# Patient Record
Sex: Female | Born: 1990 | Race: White | Hispanic: No | Marital: Married | Smoking: Never smoker
Health system: Southern US, Community
[De-identification: ages and names within clinical notes are randomized; demographics above are authoritative.]

## PROBLEM LIST (undated history)

## (undated) DIAGNOSIS — R102 Pelvic and perineal pain: Secondary | ICD-10-CM

## (undated) DIAGNOSIS — N393 Stress incontinence (female) (male): Secondary | ICD-10-CM

## (undated) DIAGNOSIS — Z973 Presence of spectacles and contact lenses: Secondary | ICD-10-CM

## (undated) DIAGNOSIS — R011 Cardiac murmur, unspecified: Secondary | ICD-10-CM

## (undated) DIAGNOSIS — Z9889 Other specified postprocedural states: Secondary | ICD-10-CM

## (undated) DIAGNOSIS — N800 Endometriosis of uterus: Secondary | ICD-10-CM

## (undated) DIAGNOSIS — R112 Nausea with vomiting, unspecified: Secondary | ICD-10-CM

## (undated) DIAGNOSIS — K219 Gastro-esophageal reflux disease without esophagitis: Secondary | ICD-10-CM

## (undated) DIAGNOSIS — J302 Other seasonal allergic rhinitis: Secondary | ICD-10-CM

## (undated) DIAGNOSIS — N3281 Overactive bladder: Secondary | ICD-10-CM

## (undated) DIAGNOSIS — F419 Anxiety disorder, unspecified: Secondary | ICD-10-CM

## (undated) DIAGNOSIS — N8003 Adenomyosis of the uterus: Secondary | ICD-10-CM

## (undated) DIAGNOSIS — J45909 Unspecified asthma, uncomplicated: Secondary | ICD-10-CM

## (undated) DIAGNOSIS — G43909 Migraine, unspecified, not intractable, without status migrainosus: Secondary | ICD-10-CM

---
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
## 2009-01-12 ENCOUNTER — Ambulatory Visit: Payer: Self-pay | Admitting: Family Medicine

## 2009-01-12 DIAGNOSIS — H1045 Other chronic allergic conjunctivitis: Secondary | ICD-10-CM | POA: Insufficient documentation

## 2009-05-29 ENCOUNTER — Encounter: Admission: RE | Admit: 2009-05-29 | Discharge: 2009-05-29 | Payer: Self-pay | Admitting: Family Medicine

## 2009-05-29 ENCOUNTER — Ambulatory Visit: Payer: Self-pay | Admitting: Family Medicine

## 2009-05-29 DIAGNOSIS — IMO0002 Reserved for concepts with insufficient information to code with codable children: Secondary | ICD-10-CM | POA: Insufficient documentation

## 2010-05-03 ENCOUNTER — Ambulatory Visit: Payer: Self-pay | Admitting: Family Medicine

## 2010-05-03 LAB — CONVERTED CEMR LAB: Heterophile Ab Screen: NEGATIVE

## 2010-05-04 ENCOUNTER — Encounter: Payer: Self-pay | Admitting: Family Medicine

## 2010-05-04 LAB — CONVERTED CEMR LAB
Eosinophils Absolute: 0.4 10*3/uL (ref 0.0–0.7)
Eosinophils Relative: 4 % (ref 0–5)
HCT: 42.3 % (ref 36.0–46.0)
Hemoglobin: 14.5 g/dL (ref 12.0–15.0)
Lymphocytes Relative: 32 % (ref 12–46)
Lymphs Abs: 2.9 10*3/uL (ref 0.7–4.0)
MCV: 86.7 fL (ref 78.0–100.0)
Monocytes Absolute: 1 10*3/uL (ref 0.1–1.0)
Platelets: 375 10*3/uL (ref 150–400)
WBC: 9.2 10*3/uL (ref 4.0–10.5)

## 2010-05-25 ENCOUNTER — Ambulatory Visit: Payer: Self-pay | Admitting: Family Medicine

## 2010-05-25 ENCOUNTER — Encounter: Admission: RE | Admit: 2010-05-25 | Discharge: 2010-05-25 | Payer: Self-pay | Admitting: Family Medicine

## 2010-05-25 DIAGNOSIS — R109 Unspecified abdominal pain: Secondary | ICD-10-CM

## 2010-05-25 LAB — CONVERTED CEMR LAB
Basophils Absolute: 0.1 10*3/uL (ref 0.0–0.1)
Bilirubin Urine: NEGATIVE
Blood in Urine, dipstick: NEGATIVE
Eosinophils Absolute: 0.6 10*3/uL (ref 0.0–0.7)
Eosinophils Relative: 5 % (ref 0–5)
HCT: 37.5 % (ref 36.0–46.0)
Hemoglobin: 13.3 g/dL (ref 12.0–15.0)
Ketones, urine, test strip: NEGATIVE
Lymphocytes Relative: 26 % (ref 12–46)
MCHC: 35.6 g/dL (ref 30.0–36.0)
MCV: 86.9 fL (ref 78.0–100.0)
Monocytes Absolute: 0.6 10*3/uL (ref 0.1–1.0)
Platelets: 363 10*3/uL (ref 150–400)
Protein, U semiquant: NEGATIVE
RDW: 12.6 % (ref 11.5–15.5)
Specific Gravity, Urine: 1.015
pH: 5.5

## 2010-05-27 LAB — CONVERTED CEMR LAB
ALT: 9 units/L (ref 0–35)
Alkaline Phosphatase: 42 units/L (ref 39–117)
CO2: 23 meq/L (ref 19–32)
Creatinine, Ser: 0.84 mg/dL (ref 0.40–1.20)
Sodium: 139 meq/L (ref 135–145)
Total Bilirubin: 0.6 mg/dL (ref 0.3–1.2)
Total Protein: 6.4 g/dL (ref 6.0–8.3)

## 2010-05-28 ENCOUNTER — Encounter: Payer: Self-pay | Admitting: Family Medicine

## 2010-08-07 ENCOUNTER — Ambulatory Visit: Payer: Self-pay | Admitting: Family Medicine

## 2010-08-07 DIAGNOSIS — N6019 Diffuse cystic mastopathy of unspecified breast: Secondary | ICD-10-CM

## 2010-10-02 NOTE — Assessment & Plan Note (Signed)
Summary: fibrocystic breasts/ OCPs   Vital Signs:  Patient profile:   20 year old female Height:      64 inches Weight:      122 pounds BMI:     21.02 O2 Sat:      100 % on Room air Temp:     98.4 degrees F oral Pulse rate:   63 / minute BP sitting:   109 / 70  (left arm) Cuff size:   regular  Vitals Entered By: Payton Spark CMA (August 07, 2010 8:53 AM)  O2 Flow:  Room air CC: Tender lump in L breast x 3 weeks. Also would like to discuss birth control.    Primary Care Brenda Hull:  Seymour Bars DO  CC:  Tender lump in L breast x 3 weeks. Also would like to discuss birth control. Marland Kitchen  History of Present Illness: 20 yo WF presents for tender lump in the L breast x 3-4 wks.  She has not noticed it change over the past month.  She normally has breast pain with her periods.  She is interest in birth control.  She was on Yaz in the past but noticed an increase in migraines the wk before her period began.  She was then on Camila but stopped it b/c of breakthrough bleeding.  She does not have an aura preceding her migraines.  She is engaged and sexually active and would like to be on a contraceptive.   Current Medications (verified): 1)  None  Allergies (verified): 1)  ! Codeine  Past History:  Past Medical History: Reviewed history from 10/21/2006 and no changes required. constipation  hx of heart murmur  Social History: Reviewed history from 12/29/2008 and no changes required. Working at [employer].  Taking online college classes. Involved in dance.  Does well in school.  Nonsmoker.  Lives w/ mom, dad. Has an older sister Morrie Sheldon. Has a boyfriend.  Sexually active.    Review of Systems      See HPI  Physical Exam  General:  alert, well-developed, well-nourished, and well-hydrated.   Mouth:  good dentition and pharynx pink and moist.   Neck:  no masses.   Breasts:  No mass, nodules, thickening, tenderness, bulging, retraction, inflamation, nipple discharge or skin changes  noted.  fibrocystic tissue bilat Lungs:  Normal respiratory effort, chest expands symmetrically. Lungs are clear to auscultation, no crackles or wheezes. Heart:  Normal rate and regular rhythm. S1 and S2 normal without gallop, murmur, click, rub or other extra sounds. Skin:  color normal.   Axillary Nodes:  No palpable lymphadenopathy Psych:  good eye contact, not anxious appearing, and not depressed appearing.     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  I do not appreciate any masses on exam today (she was squeezing breast together on self exam).  I have give her a h/o on fibrocystic breast disease today and recommended monthly self exams.    Problem # 2:  CONTRACEPTIVE MANAGEMENT (ICD-V25.09) Discussed options.  Because she has menstrual migraines, I think she would do well on extended cycle OCPs which she is very interested in.  Will start generic Seasonique on Sunday (she is currently on her period).  Expect breakthrough bleeding the first few months.  Call if any problems.  Complete Medication List: 1)  Amethia 0.15-0.03 &0.01 Mg Tabs (Levonorgest-eth estrad 91-day) .Marland Kitchen.. 1 tab by mouth daily as directed  Patient  Instructions: 1)  Start Amethia (generic for SEASONIQUE) on Sunday. 2)  Call if any problems. Prescriptions: AMETHIA 0.15-0.03 &0.01 MG TABS (LEVONORGEST-ETH ESTRAD 91-DAY) 1 tab by mouth daily as directed  #3 mos pack x 3   Entered and Authorized by:   Karen Bowen DO   Signed by:   Karen Bowen DO on 08/07/2010   Method used:   Electronically to        [facility]  [address] (retail)       [address]       [place], [place]  [address]       Ph: [phone]       Fax: [phone]   RxID:   [id number]    Orders Added: 1)  Est. Patient Level III [84132]

## 2010-10-02 NOTE — Letter (Signed)
Summary: Out of Work  [facility]  [address], [address]   [place], [place] [address]   Phone: [phone]  Fax: [phone]    May 03, 2010   Employee:  KAIYA BOATMAN    To Whom It May Concern:   For Medical reasons, please excuse the above named employee from work for the following dates:  Start:   05-03-2010  End:   Return 05-08-2010  If you need additional information, please feel free to contact our office.         Sincerely,    Nani Gasser MD

## 2010-10-02 NOTE — Assessment & Plan Note (Signed)
Summary: HA, nausea, etc   Vital Signs:  Patient profile:   20 year old female Height:      64 inches Weight:      121 pounds BMI:     20.84 Temp:     98.6 degrees F oral Pulse rate:   67 / minute BP sitting:   109 / 73  (left arm) Cuff size:   regular  Vitals Entered By: Avon Gully CMA, Duncan Dull) (May 03, 2010 12:59 PM) CC: HA' x 2 days, dizziness, finished an abx for ear infection, has not felt well since, nausea and body aches   Primary Care Provider:  Seymour Bars DO  CC:  HA' x 2 days, dizziness, finished an abx for ear infection, has not felt well since, and nausea and body aches.  History of Present Illness: HA' x 2 days, dizziness, finished an abx for ear infection, has not felt well since, nausea and body aches.  Left ear was infected and took azithromycin rx from [facility].Also put on antivert for dizziness Completed ABX almost 2 weeks ago. Her ear pain is better.  When wake up in the morning feels like something is running in her ears. Scratchy throat.  No fever. HA is frontal and causing some tension in her neck. HA is throbbing. Better wtih Advil but never really goes away. Some mild nasal congestions.  No rhinorrhea.  Nauseted but no diarrhea. having some aches in her shoulder and occ in her legs. Feels like her bones "are throbbing" at times to the point where she wants to cry.  Dizziness has gotten better. Feels tired all the time. Wants to sleep all day. Felt similar to this in 8th grade when had mono, minus the ear sxs.   Current Medications (verified): 1)  Camila 0.35 Mg Tabs (Norethindrone (Contraceptive)) .Marland Kitchen.. 1 Tab By Mouth Daily As Directed  Allergies (verified): 1)  ! Codeine  Comments:  Nurse/Medical Assistant: The patient's medications and allergies were reviewed with the patient and were updated in the Medication and Allergy Lists. Avon Gully CMA, Duncan Dull) (May 03, 2010 1:01 PM)  Physical Exam  General:   Well-developed,well-nourished,in no acute distress; alert,appropriate and cooperative throughout examination Head:  Normocephalic and atraumatic without obvious abnormalities. No apparent alopecia or balding. Eyes:  Slcera are injected. EOMi, PEERL Ears:  External ear exam shows no significant lesions or deformities.  Otoscopic examination reveals clear canals, tympanic membranes are intact bilaterally without bulging, retraction, inflammation or discharge. Hearing is grossly normal bilaterally. Nose:  External nasal examination shows no deformity or inflammation. Mouth:  Op mildy erytthematous. Neck:  No deformities, masses, or tenderness noted. Lungs:  Normal respiratory effort, chest expands symmetrically. Lungs are clear to auscultation, no crackles or wheezes. Heart:  Normal rate and regular rhythm. S1 and S2 normal without gallop, murmur, click, rub or other extra sounds. Skin:  no rashes.   Cervical Nodes:  No lymphadenopathy noted Psych:  Cognition and judgment appear intact. Alert and cooperative with normal attention span and concentration. No apparent delusions, illusions, hallucinations   Impression & Recommendations:  Problem # 1:  SINUSITIS- ACUTE-NOS (ICD-461.9) Discussed either  apartiall tx sinusitis vs viral illness like mono. Will change ABX and cover for sinusitis and will get a CBC. I am concerned about the bone aching. If the CBC is normal and she is not better after completing the ABX needs to f/u wiht Dr. Leonard Schwartz. Mono test is neg i the office.   The following medications were removed  from the medication list:    Veramyst 27.5 Mcg/spray Susp (Fluticasone furoate) .Marland Kitchen... 2 sprays per nostril daily Her updated medication list for this problem includes:    Amoxicillin 875 Mg Tabs (Amoxicillin) .Marland Kitchen... Take 1 tablet by mouth two times a day for 10 days  Complete Medication List: 1)  Camila 0.35 Mg Tabs (Norethindrone (contraceptive)) .Marland Kitchen.. 1 tab by mouth daily as directed 2)   Amoxicillin 875 Mg Tabs (Amoxicillin) .... Take 1 tablet by mouth two times a day for 10 days  Other Orders: T-CBC w/Diff ([id number]) Fingerstick ([address]) Monospot (91478)  Patient Instructions: 1)  We will call you with your lab results 2)  If you are not better after the antibiotics then please follow up iwht Dr. Cathey Endow for further evaluation. 3)  rx sent to your pharmacy.  Prescriptions: AMOXICILLIN 875 MG TABS (AMOXICILLIN) Take 1 tablet by mouth two times a day for 10 days  #20 x 0   Entered and Authorized by:   Nani Gasser MD   Signed by:   Nani Gasser MD on 05/03/2010   Method used:   Electronically to        [facility]  [address] (retail)       [address]       [place], [place]  [address]       Ph: [phone]       Fax: [phone]   RxID:   [id number]   Laboratory Results   Blood Tests   Date/Time Received: 05/03/10 Date/Time Reported: 05/03/10   Mono: negative

## 2010-10-02 NOTE — Assessment & Plan Note (Signed)
Summary: Right flank Pain   Vital Signs:  Patient profile:   20 year old female Height:      64 inches Weight:      123 pounds Temp:     98.3 degrees F oral Pulse rate:   64 / minute BP sitting:   87 / 52  (right arm) Cuff size:   regular  Vitals Entered By: Avon Gully CMA, Duncan Dull) (May 25, 2010 2:19 PM) CC: upset stomach last pm,dull pain on the RLQ,vomited this am,stool was very dark last pm,RLQ sore when you press on it   Primary Care Anslee Micheletti:  Seymour Bars DO  CC:  upset stomach last pm, dull pain on the RLQ, vomited this am, stool was very dark last pm, and RLQ sore when you press on it.  History of Present Illness: upset stomach last pm,dull pain on the RLQ,vomited this am,stool was very dark last pm,RLQ sore when you press on it. last night stomach was cramping and had a BM. Still cramping last night and felt like had to have a BM but couldn't. Finally had a hard dark BM. Then later had a severe painin her right lower back . This morning pain was less so went to work and then it felt worse. Vomited today at work.  NO urinary freq but having dysuria. No blood in the urine. No hx of UTIs.  No BMs today. Hx of chronic constipation. Worse sitting. Worse standing.   Current Medications (verified): 1)  Camila 0.35 Mg Tabs (Norethindrone (Contraceptive)) .Marland Kitchen.. 1 Tab By Mouth Daily As Directed  Allergies (verified): 1)  ! Codeine  Comments:  Nurse/Medical Assistant: The patient's medications and allergies were reviewed with the patient and were updated in the Medication and Allergy Lists. Avon Gully CMA, Duncan Dull) (May 25, 2010 2:21 PM)  Past History:  Past Medical History: Last updated: 10/21/2006 constipation  hx of heart murmur  Past Surgical History: Last updated: 06/10/2006 _  Family History: DM-father, htn, kidney stones.   mom chronic fatigue syndrome  Physical Exam  General:  Well-developed,well-nourished,in no acute distress;  alert,appropriate and cooperative throughout examination Head:  Normocephalic and atraumatic without obvious abnormalities. No apparent alopecia or balding. Eyes:  No corneal or conjunctival inflammation noted. EOMI. Perrla.  Ears:  External ear exam shows no significant lesions or deformities.  Otoscopic examination reveals clear canals, tympanic membranes are intact bilaterally without bulging, retraction, inflammation or discharge. Hearing is grossly normal bilaterally. Nose:  no external deformity.   Mouth:  Oral mucosa and oropharynx without lesions or exudates.  Teeth in good repair. Neck:  No deformities, masses, or tenderness noted. Lungs:  Normal respiratory effort, chest expands symmetrically. Lungs are clear to auscultation, no crackles or wheezes. Heart:  Normal rate and regular rhythm. S1 and S2 normal without gallop, murmur, click, rub or other extra sounds. Abdomen:  soft, no distention, no masses, no guarding, no hepatomegaly, and no splenomegaly.  Tender in teh Right uppper quadrant and some in the suprapubic area.  Msk:  No CVA tenderness.  Seh is stting on the table leaning to her left.  Pulses:  Radial 2+  Neurologic:  alert & oriented X3.   Skin:  no rashes.   Cervical Nodes:  No lymphadenopathy noted Psych:  Cognition and judgment appear intact. Alert and cooperative with normal attention span and concentration. No apparent delusions, illusions, hallucinations   Impression & Recommendations:  Problem # 1:  FLANK PAIN, RIGHT (ICD-789.09) UA is neg. Based on history I was  most suspicious of pyelonephritis. I will send UA for culture. Also has a family hx of kidney stones so will get a KUB to look for stones and get a stat CBC.  Appendicitis unlikey.  Has 3 cups of tea and soda a day.  Orders: UA Dipstick w/o Micro (automated)  (81003) T-CBC w/Diff ([id number]) T-*Unlisted Diagnostic X-ray test/procedure ([address]) T-Comprehensive Metabolic Panel ([phone]) T-Urine  Culture (Spectrum Order) ([id number])  Complete Medication List: 1)  Camila 0.35 Mg Tabs (Norethindrone (contraceptive)) .Marland Kitchen.. 1 tab by mouth daily as directed  Patient Instructions: 1)  Will call you with the lab and xray results later today.   Laboratory Results   Urine Tests  Date/Time Received: 05/25/10 Date/Time Reported: 05/25/10  Routine Urinalysis   Color: yellow Appearance: Clear Glucose: negative   (Normal Range: Negative) Bilirubin: negative   (Normal Range: Negative) Ketone: negative   (Normal Range: Negative) Spec. Gravity: 1.015   (Normal Range: 1.003-1.035) Blood: negative   (Normal Range: Negative) pH: 5.5   (Normal Range: 5.0-8.0) Protein: negative   (Normal Range: Negative) Urobilinogen: 0.2   (Normal Range: 0-1) Nitrite: negative   (Normal Range: Negative) Leukocyte Esterace: negative   (Normal Range: Negative)        Appended Document: Right flank Pain    Clinical Lists Changes  Orders: Added new Service order of Ketorolac-Toradol 15mg  [id number]) - Signed Added new Service order of Promethazine up to 50mg  (M8413) - Signed Added new Service order of Admin of Therapeutic Inj  intramuscular or subcutaneous (24401) - Signed       Medication Administration  Injection # 1:    Medication: Ketorolac-Toradol 15mg     Diagnosis: FLANK PAIN, RIGHT (ICD-789.09)    Route: IM    Site: RUOQ gluteus    Exp Date: 11/01/2011    Lot #: [id number]    Mfr: hospira    Patient tolerated injection without complications    Given by: Avon Gully CMA, Duncan Dull) (May 25, 2010 4:46 PM)  Injection # 2:    Medication: Promethazine up to 50mg     Diagnosis: FLANK PAIN, RIGHT (ICD-789.09)    Route: IM    Site: LUOQ gluteus    Exp Date: 05/04/2011    Lot #: [id number]    Mfr: [employer]    Patient tolerated injection without complications    Given by: Avon Gully CMA, Duncan Dull) (May 25, 2010 4:47 PM)  Orders Added: 1)  Ketorolac-Toradol 15mg   [J1885] 2)  Promethazine up to 50mg  [J2550] 3)  Admin of Therapeutic Inj  intramuscular or subcutaneous [74259]

## 2010-10-23 ENCOUNTER — Ambulatory Visit (INDEPENDENT_AMBULATORY_CARE_PROVIDER_SITE_OTHER): Payer: BC Managed Care – PPO | Admitting: Family Medicine

## 2010-10-23 ENCOUNTER — Encounter: Payer: Self-pay | Admitting: Family Medicine

## 2010-10-23 DIAGNOSIS — J019 Acute sinusitis, unspecified: Secondary | ICD-10-CM

## 2010-10-25 ENCOUNTER — Telehealth: Payer: Self-pay | Admitting: Family Medicine

## 2010-10-26 ENCOUNTER — Ambulatory Visit (INDEPENDENT_AMBULATORY_CARE_PROVIDER_SITE_OTHER): Payer: BC Managed Care – PPO | Admitting: Family Medicine

## 2010-10-26 ENCOUNTER — Encounter: Payer: Self-pay | Admitting: Family Medicine

## 2010-10-26 DIAGNOSIS — J019 Acute sinusitis, unspecified: Secondary | ICD-10-CM

## 2010-10-30 NOTE — Assessment & Plan Note (Signed)
Summary: sinusitis   Vital Signs:  Patient profile:   20 year old female Height:      64 inches (162.56 cm) Weight:      123.25 pounds (56.02 kg) BMI:     21.23 O2 Sat:      96 % on Room air Temp:     98.3 degrees F (36.83 degrees C) oral Pulse rate:   68 / minute BP sitting:   105 / 69  (right arm) Cuff size:   regular  Vitals Entered By: Lucious Groves CMA (October 26, 2010 11:52 AM)  O2 Flow:  Room air CC: F/U--still not feeling better, recent sinusitis./kb Is Patient Diabetic? No Pain Assessment Patient in pain? no      Comments Patient notes that she has been having fever, HA, cough producing slight green mucous, and nausea. Patient has not had any vomiting. She notes chest tightness possibly to due congestion.   Primary Care Provider:  Seymour Bars DO  CC:  F/U--still not feeling better and recent sinusitis./kb.  History of Present Illness: 20 yo WF presents for still feeling bad.  She went back to work yesterday and she has a HA and was sweating yesterday.  She is on day 3 of Amoxicillin for sinusitis.  She has some cough that kept her up last night.  Taking Tylenol.    She still has nausea and has improved with zofran.  She has some chest tightness but no SOB or sputum production.  Denies V/D.  She claims that her temp goes up everytime her Tylenol wears off but does not recall what her temp is.  c/o fatigue, malaise, scratchy throat and bodyaches.    Current Medications (verified): 1)  Amethia 0.15-0.03 &0.01 Mg Tabs (Levonorgest-Eth Estrad 91-Day) .Marland Kitchen.. 1 Tab By Mouth Daily As Directed 2)  Amoxicillin 875 Mg Tabs (Amoxicillin) .Marland Kitchen.. 1 Tab By Mouth Q 12 Hrs X 10 Days 3)  Ondansetron 8 Mg Tbdp (Ondansetron) .Marland Kitchen.. 1 Tab By Mouth Three Times A Day As Needed Nausea  Allergies (verified): 1)  ! Codeine  Past History:  Past Medical History: Reviewed history from 10/21/2006 and no changes required. constipation  hx of heart murmur  Social History: Reviewed history  from 12/29/2008 and no changes required. Working at [employer].  Taking online college classes. Involved in dance.  Does well in school.  Nonsmoker.  Lives w/ mom, dad. Has an older sister Morrie Sheldon. Has a boyfriend.  Sexually active.    Review of Systems      See HPI  Physical Exam  General:  alert, well-developed, well-nourished, and well-hydrated.   Head:  normocephalic and atraumatic.  no focal areas of tenderness Eyes:  conjunctiva clear Ears:  EACs patent; TMs translucent and gray with good cone of light and bony landmarks.  Nose:  scant rhinorrhea Mouth:  o/p mildly injected Neck:  no masses.   Chest Wall:  no tenderness.   Lungs:  Normal respiratory effort, chest expands symmetrically. Lungs are clear to auscultation, no crackles or wheezes. Heart:  Normal rate and regular rhythm. S1 and S2 normal without gallop, murmur, click, rub or other extra sounds. Abdomen:  soft, non-tender, no distention, no hepatomegaly, and no splenomegaly.   Skin:  color normal and no rashes.  chapped lips Cervical Nodes:  L>R submandibular LA Psych:  good eye contact, not anxious appearing, and flat affect.     Impression & Recommendations:  Problem # 1:  SINUSITIS- ACUTE-NOS (ICD-461.9) Lack of clinical improvement after 72 hrs  of Amoxicilin with ? fevers, malaise, HAs.  She may be having migraines secondary to her infection given her hx of migraines.  Will change Amox to Avelox, samples given x 7 days.  Out of work note given.  OK to continue OTC meds for symptom relief.  Stop Zofran due to interaction with Avelox.  Rest, hydrate and call Mon if not improving.  Prednsione added for bodyaches, HA and chest tightness. Her updated medication list for this problem includes:    Avelox 400 Mg Tabs (Moxifloxacin hcl) .Marland Kitchen... 1 tab by mouth once daily x 7 days  Complete Medication List: 1)  Amethia 0.15-0.03 &0.01 Mg Tabs (Levonorgest-eth estrad 91-day) .Marland Kitchen.. 1 tab by mouth daily as directed 2)  Avelox 400 Mg  Tabs (Moxifloxacin hcl) .Marland Kitchen.. 1 tab by mouth once daily x 7 days 3)  Prednisone 20 Mg Tabs (Prednisone) .... 2 tabs by mouth once daily x 5 days  Patient Instructions: 1)  Stop Amoxicillin. 2)  Replace with Avelox once daily (samples given) for 7 days (antibiotics). 3)  Take Prednisone 40 mg once a day x 5 days for bodyaches and chest tightness. 4)  Avoid using Odansetron due to interaction with Avelox. 5)  REst, clear fluids. 6)  Call if not improving by Monday/ Tuesday. Prescriptions: PREDNISONE 20 MG TABS (PREDNISONE) 2 tabs by mouth once daily x 5 days  #10 x 0   Entered and Authorized by:   Seymour Bars DO   Signed by:   Seymour Bars DO on 10/26/2010   Method used:   Electronically to        [facility]  [address] (retail)       [address]       [place], [place]  [address]       Ph: [phone]       Fax: [phone]   RxID:   [id number]    Orders Added: 1)  Est. Patient Level III [84132]

## 2010-10-30 NOTE — Assessment & Plan Note (Signed)
Summary: sinusitis   Vital Signs:  Patient profile:   20 year old female Height:      64 inches Weight:      124 pounds BMI:     21.36 O2 Sat:      100 % on Room air Temp:     98.8 degrees F oral Pulse rate:   92 / minute BP sitting:   104 / 67  (left arm) Cuff size:   regular  Vitals Entered By: Payton Spark CMA (October 23, 2010 10:09 AM)  O2 Flow:  Room air CC: Sneezing, cough, ST, HA, fever and ear pain x 1 week but getting worse.   Primary Care Provider:  Seymour Bars DO  CC:  Sneezing, cough, ST, HA, and fever and ear pain x 1 week but getting worse.Marland Kitchen  History of Present Illness: 20 yo previously healthy WF presents for illness that started 6 days ago.  Started with sneezing and coughing and sore throat.  She also states she had a headache and a runny nose.  She tried taking Tylenol for it but this did not alleviate her symptoms.  Over the weekend she began feeling worse and having some ear pain in both ears.  This morning she had a fever to 101.5 and did not go to work.  She took Tylenol this morning at 630.  She denies abdominal pain but states she has had nausea and this morning she had emesis x 1.  She has several sick contacts at work who have been missing work due to illness.  Chest tightness with coughing but no pain or tightness on normal breathing. Having sinus pressure and postnasal drip.   She had some muscle aches this morning but this is a new symptom.  She has tried to drink fluids and stay well hydrated.  She did not receive her influenza vaccine this year.    Current Medications (verified): 1)  Amethia 0.15-0.03 &0.01 Mg Tabs (Levonorgest-Eth Estrad 91-Day) .Marland Kitchen.. 1 Tab By Mouth Daily As Directed  Allergies (verified): 1)  ! Codeine  Past History:  Past Medical History: Reviewed history from 10/21/2006 and no changes required. constipation  hx of heart murmur  Social History: Reviewed history from 12/29/2008 and no changes required. Working at [employer].   Taking online college classes. Involved in dance.  Does well in school.  Nonsmoker.  Lives w/ mom, dad. Has an older sister Morrie Sheldon. Has a boyfriend.  Sexually active.    Review of Systems      See HPI  Physical Exam  General:  alert, here with mom in mild distress Head:  normocephalic and atraumatic.  frontal and maxillary sinuses TTP Eyes:  .  Conjunctiva clear. Ears:  EACs patent; TMs translucent and gray with good cone of light and bony landmarks.  Nose:   No discharge or erythema.  Turbinates do not appear swollen. Mouth:  Oropharynx slightly injected. Neck:  Minimal submandibular lymphadenopathy Lungs:  normal respiratory effort, normal breath sounds, no crackles, and no wheezes.   Heart:  normal rate, regular rhythm, no murmur, no gallop, and no rub.   Abdomen:  Diffusely tender in LUQ and LLQ. Soft, normal bowel sounds, and no distention.   Skin:  color normal and no rashes.  dry cracked lips   Impression & Recommendations:  Problem # 1:  SINUSITIS- ACUTE-NOS (ICD-461.9) Patient has had symptoms for 6 days but only recently spiked a fever and concern is for seondary bacterial sinusitis.  Patient instructed to continue symptomatic  management with Advil Cold and Flu and hydration.  Will start Amoxicillin 875mg  two times a day and patient instructed to call if symptoms are not markedly improved in 7-10 days.  Use Odansetron for nausea.  Out of work note given for today and tomorrow.  Her updated medication list for this problem includes:    Amoxicillin 875 Mg Tabs (Amoxicillin) .Marland Kitchen... 1 tab by mouth q 12 hrs x 10 days  Complete Medication List: 1)  Amethia 0.15-0.03 &0.01 Mg Tabs (Levonorgest-eth estrad 91-day) .Marland Kitchen.. 1 tab by mouth daily as directed 2)  Amoxicillin 875 Mg Tabs (Amoxicillin) .Marland Kitchen.. 1 tab by mouth q 12 hrs x 10 days 3)  Ondansetron 8 Mg Tbdp (Ondansetron) .Marland Kitchen.. 1 tab by mouth three times a day as needed nausea  Patient Instructions: 1)  Treat sinusitis with 10 days  of Amoxicillin -- use back up birth control x 1 month since this interacts with your birth control pill. 2)  Use Odansetron as needed for nausea. 3)  Rest, hydrate-- sip on clear liquids throughout the day. 4)  OK to take Advil Cold and Flu for symptom relief. 5)  Call if not improved in 7-10 days. Prescriptions: ONDANSETRON 8 MG TBDP (ONDANSETRON) 1 tab by mouth three times a day as needed nausea  #15 x 0   Entered and Authorized by:   Seymour Bars DO   Signed by:   Seymour Bars DO on 10/23/2010   Method used:   Electronically to        [facility]  [address] (retail)       [address]       [place], [place]  [address]       Ph: [phone]       Fax: [phone]   RxID:   [id number] AMOXICILLIN 875 MG TABS (AMOXICILLIN) 1 tab by mouth q 12 hrs x 10 days  #20 x 0   Entered and Authorized by:   Seymour Bars DO   Signed by:   Seymour Bars DO on 10/23/2010   Method used:   Electronically to        [facility]  [address] (retail)       [address]       [place], [place]  [address]       Ph: [phone]       Fax: [phone]   RxID:   [id number]    Orders Added: 1)  Est. Patient Level III [69485]

## 2010-10-30 NOTE — Letter (Signed)
Summary: Out of Work  [facility]  [address]   [place], [place] [address]   Phone: [phone]  Fax: [phone]    October 26, 2010   Employee:  JYL CHICO    To Whom It May Concern:   For Medical reasons, please excuse the above named employee from work for the following dates:  Start:   Feb 23rd- 24th  End:   Feb 25th  If you need additional information, please feel free to contact our office.         Sincerely,    Seymour Bars DO

## 2010-10-30 NOTE — Letter (Signed)
Summary: Out of Work  [facility]  [address]   [place], [place] [address]   Phone: [phone]  Fax: [phone]    October 23, 2010   Employee:  Brenda Hull    To Whom It May Concern:   For Medical reasons, please excuse the above named employee from work for the following dates:  Start:   Feb 21- 22  End:   Feb 23rd  If you need additional information, please feel free to contact our office.         Sincerely,    Seymour Bars DO

## 2010-10-30 NOTE — Progress Notes (Signed)
Summary: Please advise Patient  Phone Note Call from Patient   Caller: Patient Summary of Call: Patient was seen on Tuesday morning and she is not feeling any better and still has fever and feels like the anibiotics are not working and does not feel like she can return to work today as her work excuse states. Please advise her of what to do... Call patient at [phone] Initial call taken by: Michaelle Copas,  October 25, 2010 12:20 PM  Follow-up for Phone Call        I will write her an out of work note for today (but I'm not there to sign it today).  She is <48hrs on Amoxicillin, so a little too early to tell if it's working.  See if she can RTC tomorrow to f/u with me.   Follow-up by: Seymour Bars DO,  October 25, 2010 2:09 PM

## 2010-10-31 ENCOUNTER — Encounter: Payer: Self-pay | Admitting: Family Medicine

## 2010-10-31 ENCOUNTER — Ambulatory Visit (INDEPENDENT_AMBULATORY_CARE_PROVIDER_SITE_OTHER): Payer: BC Managed Care – PPO | Admitting: Family Medicine

## 2010-10-31 DIAGNOSIS — K5909 Other constipation: Secondary | ICD-10-CM

## 2010-10-31 DIAGNOSIS — J019 Acute sinusitis, unspecified: Secondary | ICD-10-CM

## 2010-11-08 NOTE — Assessment & Plan Note (Signed)
Summary: constipation   Vital Signs:  Patient profile:   20 year old female Height:      64 inches Weight:      124 pounds BMI:     21.36 O2 Sat:      100 % on Room air Temp:     98.3 degrees F oral Pulse rate:   89 / minute BP sitting:   108 / 69  (left arm) Cuff size:   regular  Vitals Entered By: Payton Spark CMA (October 31, 2010 1:57 PM)  O2 Flow:  Room air CC: Deep cough at night. Also c/o belly tenderness and constipation   Primary Care Provider:  Seymour Bars DO  CC:  Deep cough at night. Also c/o belly tenderness and constipation.  History of Present Illness: 20 yo WF presents for a continued dry cough esp at night that is keeping her up with some chest tightness.  Recently treated for sinusitis.  She is on the end of her Avelox and is having some nausea and constipation.  She has a hx of constipation on and off in the past.  She has not taking anything for constipation.  She has 2 more days of Avelox.  She is no longer having nasal congestion or sinus pressure.  She has some chest congestion.  Only taking Tylenol OTC.  Cough is dry.  She has chills but no fevers.  No vomitting.    Current Medications (verified): 1)  Amethia 0.15-0.03 &0.01 Mg Tabs (Levonorgest-Eth Estrad 91-Day) .Marland Kitchen.. 1 Tab By Mouth Daily As Directed 2)  Avelox 400 Mg Tabs (Moxifloxacin Hcl) .Marland Kitchen.. 1 Tab By Mouth Once Daily X 7 Days 3)  Prednisone 20 Mg Tabs (Prednisone) .... 2 Tabs By Mouth Once Daily X 5 Days  Allergies (verified): 1)  ! Codeine  Past History:  Past Medical History: Reviewed history from 10/21/2006 and no changes required. constipation  hx of heart murmur  Social History: Working Electrical engineer.  Taking online college classes. Involved in dance.  Does well in school.  Nonsmoker.  Lives w/ mom, dad. Has an older sister Morrie Sheldon. Has a boyfriend.  Sexually active.    Review of Systems      See HPI  Physical Exam  General:  alert, well-developed, well-nourished, and well-hydrated.    Head:  normocephalic and atraumatic.  sinuses NTTP Eyes:  conjunctiva clear Ears:  EACs patent; TMs translucent and gray with good cone of light and bony landmarks.  Nose:  no rhinorrhea or nasal congestion Mouth:  o/p pink and moist, no erythema Neck:  no masses.   Lungs:  Normal respiratory effort, chest expands symmetrically. Lungs are clear to auscultation, no crackles or wheezes. Heart:  Normal rate and regular rhythm. S1 and S2 normal without gallop, murmur, click, rub or other extra sounds. Abdomen:  diffusely tender to deep palpation but no R/G/R.  hypoactive BS.  stool palpable in the R proximal colonsoft, no hepatomegaly, and no splenomegaly.   Skin:  color normal.   Cervical Nodes:  No lymphadenopathy noted Psych:  good eye contact, not anxious appearing, and not depressed appearing.     Impression & Recommendations:  Problem # 1:  SINUSITIS- ACUTE-NOS (ICD-461.9) Assessment Improved Her ABS seems much improved.  She can stop Avelox (took for 5 days) + 3 days of Amox. She can stop prednisone since it was causing SEs.  Her updated medication list for this problem includes:    Avelox 400 Mg Tabs (Moxifloxacin hcl) .Marland Kitchen... 1 tab by mouth  once daily x 7 days  Problem # 2:  CONSTIPATION, CHRONIC (ICD-564.09) Flare of chronic constipation causing nausea secondary to recent use of abx and cold meds. Will treat by increasing water, fiber and adding Miralax two times a day until BMs are regular. If not starting to improve in 48 hrs, call and will get a KUB and labs.  Complete Medication List: 1)  Amethia 0.15-0.03 &0.01 Mg Tabs (Levonorgest-eth estrad 91-day) .Marland Kitchen.. 1 tab by mouth daily as directed 2)  Avelox 400 Mg Tabs (Moxifloxacin hcl) .Marland Kitchen.. 1 tab by mouth once daily x 7 days 3)  Prednisone 20 Mg Tabs (Prednisone) .... 2 tabs by mouth once daily x 5 days  Patient Instructions: 1)  Use OTC Miralax tonight and twice tomorrow to regulate bowels. 2)  Stop Avelox. 3)  OK to resume  Zofran for nausea. 4)  Drink plenty of water and try OTC Phillips colon health to restore normal gut flora following use of antibiotics. 5)  OK to use OTC Robitussin DM for cough/ congestion. 6)  Call Fri if abdominal symptoms have not improved.   Orders Added: 1)  Est. Patient Level III [04540]

## 2011-02-18 ENCOUNTER — Encounter: Payer: Self-pay | Admitting: Family Medicine

## 2011-02-18 ENCOUNTER — Ambulatory Visit (INDEPENDENT_AMBULATORY_CARE_PROVIDER_SITE_OTHER): Payer: BC Managed Care – PPO | Admitting: Family Medicine

## 2011-02-18 ENCOUNTER — Ambulatory Visit
Admission: RE | Admit: 2011-02-18 | Discharge: 2011-02-18 | Disposition: A | Discharge status: Home / Self Care | Payer: BC Managed Care – PPO | Source: Ambulatory Visit | Attending: Family Medicine | Admitting: Family Medicine

## 2011-02-18 VITALS — BP 116/71 | HR 66 | Ht 63.0 in | Wt 125.0 lb

## 2011-02-18 DIAGNOSIS — G43909 Migraine, unspecified, not intractable, without status migrainosus: Secondary | ICD-10-CM

## 2011-02-18 DIAGNOSIS — G43001 Migraine without aura, not intractable, with status migrainosus: Secondary | ICD-10-CM

## 2011-02-18 MED ORDER — PROMETHAZINE HCL 25 MG/ML IJ SOLN
25.0000 mg | Freq: Once | INTRAMUSCULAR | Status: AC
  Administered 2011-02-18: 25 mg via INTRAMUSCULAR

## 2011-02-18 MED ORDER — KETOROLAC TROMETHAMINE 60 MG/2ML IM SOLN
60.0000 mg | Freq: Once | INTRAMUSCULAR | Status: AC
  Administered 2011-02-18: 60 mg via INTRAMUSCULAR

## 2011-02-18 MED ORDER — PROMETHAZINE HCL 25 MG RE SUPP: 12.5000 mg | Freq: Four times a day (QID) | RECTAL | Status: AC | PRN

## 2011-02-18 MED ORDER — RIZATRIPTAN BENZOATE 10 MG PO TBDP: 10.0000 mg | ORAL_TABLET | ORAL | Status: DC | PRN

## 2011-02-18 NOTE — Progress Notes (Signed)
  Subjective:    Patient ID: Brenda Hull, female    DOB: Jun 25, 1991, 20 y.o.   MRN: [id number]  HPI  20 yo WF presents for a migraine that started on Thursday night.  She has taken OTC Tylenol.  She cannot see well.  The severity is worse than usual.  She has trouble getting her words out.  She feels very tired.  She has had nausea but no vomitting.  She has some diplopia.  She has never had a CT of her head.  She tried to work today.  Denies tingling or numbness or weakness.  Denies double vision.  Denies fevers, chills, sinus pain.  Usually has about 1 migraine every other month.  BP 116/71  Pulse 66  Ht 5\' 3"  (1.6 m)  Wt 125 lb (56.7 kg)  BMI 22.14 kg/m2  SpO2 100%   Review of Systems  Constitutional: Positive for fatigue. Negative for fever and chills.  HENT: Positive for neck pain. Negative for neck stiffness.   Eyes: Positive for photophobia and visual disturbance.  Respiratory: Negative for shortness of breath.   Cardiovascular: Negative for chest pain and palpitations.  Gastrointestinal: Positive for nausea. Negative for vomiting and diarrhea.  Neurological: Positive for speech difficulty and headaches. Negative for dizziness, seizures, weakness, light-headedness and numbness.  Psychiatric/Behavioral: Positive for decreased concentration. Negative for dysphoric mood. The patient is not nervous/anxious.        Objective:   Physical Exam  Constitutional: She appears well-developed and well-nourished. No distress.  HENT:  Head: Normocephalic and atraumatic.  Mouth/Throat: Oropharynx is clear and moist.  Eyes: Conjunctivae are normal. Pupils are equal, round, and reactive to light. No scleral icterus.  Neck: Neck supple. No thyromegaly present.  Cardiovascular: Normal rate, regular rhythm and normal heart sounds.   Pulmonary/Chest: Effort normal and breath sounds normal.  Lymphadenopathy:    She has no cervical adenopathy.  Neurological: She exhibits normal muscle tone.    Skin: Skin is warm and dry.  Psychiatric: She has a normal mood and affect.          Assessment & Plan:

## 2011-02-18 NOTE — Patient Instructions (Addendum)
2 injections given today for migraine. Go home and try to rest after your head CT downstairs.  Will call you with CT results tomorrow.  For next migraine, try Maxalt MLT tab - take 1 tab at onset of migraine, repeat in 2 hrs if needed.  Try not to use more than 2 x a wk. Use Phenergan suppositories as needed for nausea.  They will make you sleepy.   Return for f/u migraines in 1 month.

## 2011-02-19 ENCOUNTER — Telehealth: Payer: Self-pay | Admitting: Family Medicine

## 2011-02-19 NOTE — Assessment & Plan Note (Signed)
Severe intractable migraine with visual disturbance, nause and trouble getting words out.  CT head done: normal. Will treat with Toradol and Phenergan injections.  RX for Maxalt with sample given for migraine rescue for next HA.  Call if any problems.  Should not need prophylaxis given infrequency of migraines.

## 2011-02-19 NOTE — Telephone Encounter (Signed)
Pt's mother aware of the above

## 2011-02-19 NOTE — Telephone Encounter (Signed)
Pls let pt know that her CT of the brain came back normal.

## 2011-03-15 ENCOUNTER — Encounter: Payer: Self-pay | Admitting: Family Medicine

## 2011-03-15 ENCOUNTER — Inpatient Hospital Stay (INDEPENDENT_AMBULATORY_CARE_PROVIDER_SITE_OTHER)
Admission: RE | Admit: 2011-03-15 | Discharge: 2011-03-15 | Disposition: A | Discharge status: Home / Self Care | Payer: BC Managed Care – PPO | Source: Ambulatory Visit | Attending: Family Medicine | Admitting: Family Medicine

## 2011-03-15 DIAGNOSIS — R300 Dysuria: Secondary | ICD-10-CM

## 2011-03-15 DIAGNOSIS — R5383 Other fatigue: Secondary | ICD-10-CM

## 2011-03-15 DIAGNOSIS — K5909 Other constipation: Secondary | ICD-10-CM

## 2011-03-15 DIAGNOSIS — M545 Low back pain: Secondary | ICD-10-CM

## 2011-03-15 DIAGNOSIS — M76899 Other specified enthesopathies of unspecified lower limb, excluding foot: Secondary | ICD-10-CM

## 2011-03-15 LAB — CONVERTED CEMR LAB
Beta hcg, urine, semiquantitative: NEGATIVE
Glucose, Urine, Semiquant: NEGATIVE
Ketones, urine, test strip: NEGATIVE
Nitrite: NEGATIVE
Specific Gravity, Urine: >=1.03
WBC Urine, dipstick: NEGATIVE

## 2011-03-16 ENCOUNTER — Encounter: Payer: Self-pay | Admitting: Family Medicine

## 2011-05-23 ENCOUNTER — Other Ambulatory Visit: Payer: Self-pay | Admitting: *Deleted

## 2011-05-23 MED ORDER — LEVONORGEST-ETH ESTRAD 91-DAY 0.15-0.03 &0.01 MG PO TABS: 1.0000 | ORAL_TABLET | Freq: Every day | ORAL | Status: DC

## 2011-08-05 NOTE — Letter (Signed)
Summary: Out of Work  [facility]  [address]   [place], [place] [address]   Phone: [phone]  Fax: [phone]    March 15, 2011   Employee:  MAISEN SCHMIT    To Whom It May Concern:   For Medical reasons, please excuse the above named employee from work today.    If you need additional information, please feel free to contact our office.         Sincerely,    Donna Christen MD

## 2011-08-05 NOTE — Progress Notes (Signed)
Summary: back pain/[place] rm 4   Vital Signs:  Patient Profile:   20 Years Old Female CC:      RT sided LBP, constipation and burning with urination x 1wk Height:     64 inches (162.56 cm) Weight:      125.75 pounds O2 Sat:      100 % O2 treatment:    Room Air Temp:     98.5 degrees F oral Pulse rate:   82 / minute Resp:     14 per minute BP sitting:   102 / 70  (left arm) Cuff size:   regular  Vitals Entered By: Clemens Catholic LPN (March 15, 2011 8:18 AM)                  Updated Prior Medication List: SEASONIQUE 0.15-0.03 &0.01 MG TABS (LEVONORGEST-ETH ESTRAD 91-DAY)   Current Allergies (reviewed today): ! CODEINEHistory of Present Illness Chief Complaint: RT sided LBP, constipation and burning with urination x 1wk History of Present Illness:  Subjective:  Patient presents with several problems: 1)  5 days ago she developed dysuria, nocturia, and hesitancy.  No fevers, chills, and sweats or abdominal pain.  No vaginal discharge or pelvic pain.  Her periods occur every 3 months, and last menstrual period was on time. 2)  She has been constipated, and notes that she has a BM every several days and has to strain. 3)  She feels fatigued.  She has occasional mild nausea without vomiting. 4)  She complains of right lower back pain that does not radiate.  The pain is somewhat worse with movement.  She states that she has started walking around a track regularly over the past several weeks.  She note that she has been diagnosed with a mild leg length discrepancy in the past.  REVIEW OF SYSTEMS Constitutional Symptoms      Denies fever, chills, night sweats, weight loss, weight gain, and fatigue.  Eyes       Complains of contact lenses.      Denies change in vision, eye pain, eye discharge, glasses, and eye surgery. Ear/Nose/Throat/Mouth       Denies hearing loss/aids, change in hearing, ear pain, ear discharge, dizziness, frequent runny nose, frequent nose bleeds, sinus  problems, sore throat, hoarseness, and tooth pain or bleeding.  Respiratory       Denies dry cough, productive cough, wheezing, shortness of breath, asthma, bronchitis, and emphysema/COPD.  Cardiovascular       Denies murmurs, chest pain, and tires easily with exhertion.    Gastrointestinal       Complains of constipation.      Denies stomach pain, nausea/vomiting, diarrhea, blood in bowel movements, and indigestion.      Comments: nausea Genitourniary       Complains of painful urination.      Denies kidney stones and loss of urinary control. Neurological       Complains of headaches.      Denies paralysis, seizures, and fainting/blackouts. Musculoskeletal       Denies muscle pain, joint pain, joint stiffness, decreased range of motion, redness, swelling, muscle weakness, and gout.  Skin       Denies bruising, unusual mles/lumps or sores, and hair/skin or nail changes.  Psych       Denies mood changes, temper/anger issues, anxiety/stress, speech problems, depression, and sleep problems. Other Comments: pt c/o painful/burniing with urination, constipation, fatigue, and RT sided LBP x 1wk. no fever. she has taken Aleve.  Past History:  Past Medical History: constipation  hx of heart murmur migraines  Past Surgical History: _ Denies surgical history  Family History: Reviewed history from 05/25/2010 and no changes required. DM-father, htn, kidney stones.   mom chronic fatigue syndrome  Social History: Reviewed history from 10/31/2010 and no changes required. Working [employer].  Taking online college classes. Involved in dance.  Does well in school.  Nonsmoker.  Lives w/ mom, dad. Has an older sister Morrie Sheldon. Has a boyfriend.  Sexually active.     Objective:  Appearance:  Patient appears healthy, stated age, and in no acute distress  Eyes:  Pupils are equal, round, and reactive to light and accomodation.  Extraocular movement is intact.  Conjunctivae are not inflamed.  Pharynx:   Normal  Neck:  Supple.  No adenopathy is present.  No thyromegaly is present  Lungs:  Clear to auscultation.  Breath sounds are equal.  Heart:  Regular rate and rhythm without murmurs, rubs, or gallops.  Abdomen:  Nontender without masses or hepatosplenomegaly.  Bowel sounds are present.  No CVA or flank tenderness.   Back:   Good range of motion.  Mild tenderness in the midline from L4 to Sacral area.  There is tenderness over the right lumbar area and right SI joint. Straight leg raising test is negative.  Sitting knee extension test is negative.  Strength and sensation in the lower extremities is normal.  Patellar reflexes are normal.  Extremities:  No edema.  Right hip has distinct tenderness over the greater trochanter.  Palpation there during resisted lateral abduction of her right hip recreates pain.  Left leg is slightly longer than the right by inspection. Skin:  No rash urinalysis (dipstick):  Negative CBC:  WBC 6.2 ; LY 39.4, MO 8.7, GR 51.9; Hgb 12.4 Urine pregnancy test:  negative  Assessment  Assessed CONSTIPATION, CHRONIC as deteriorated - Donna Christen MD Assessed FATIGUE as deteriorated - Donna Christen MD New Problems: LOW BACK PAIN, MILD (ICD-724.2) TROCHANTERIC BURSITIS, RIGHT (ICD-726.5) DYSURIA (ICD-788.1)  NOTE THAT LEFT LEG IS SLIGHTLY LONGER THAN RIGHT (ABOUT 1CM )  Plan New Medications/Changes: SULFAMETHOXAZOLE-TMP DS 800-160 MG TABS (SULFAMETHOXAZOLE-TRIMETHOPRIM) One by mouth two times a day  #10 x 0, 03/15/2011, Donna Christen MD  New Orders: Urinalysis [[id number]] Urine Pregnancy Test  [81025] CBC w/Diff [[id number]] T-TSH [[id number]] T-Culture, Urine [[id number]] New Patient Level V [99205] Planning Comments:   Urine Culture pending.  Begin Septra DS for 5 days.  May take Azo for 1 to 2 days. Check TSH. Increase fluid intake.  May resume Miralax for several days.  Add Colace.  Increase fiber/vegitables/fruit Begin Ibuprofen 200mg , 3 tabs every  8 hours with food for about 5 to 7 days for right hip.  Apply ice pack several times daily.  Begin hip stretching exercises ([employer] information and instruction patient handout given).  Obtain shoe insert for right shoe. Followup with [facility] if right hip pain not improved two weeks.   The patient and/or caregiver has been counseled thoroughly with regard to medications prescribed including dosage, schedule, interactions, rationale for use, and possible side effects and they verbalize understanding.  Diagnoses and expected course of recovery discussed and will return if not improved as expected or if the condition worsens. Patient and/or caregiver verbalized understanding.  Prescriptions: SULFAMETHOXAZOLE-TMP DS 800-160 MG TABS (SULFAMETHOXAZOLE-TRIMETHOPRIM) One by mouth two times a day  #10 x 0   Entered and Authorized by:   Donna Christen MD   Signed by:  Donna Christen MD on 03/15/2011   Method used:   Print then Give to Patient   RxID:   [id number]   Orders Added: 1)  Urinalysis [81003-65000] 2)  Urine Pregnancy Test  [81025] 3)  CBC w/Diff [[id number]] 4)  T-TSH [[id number]] 5)  T-Culture, Urine [[id number]] 6)  New Patient Level V [99205]    Laboratory Results   Urine Tests  Date/Time Received: March 15, 2011 8:22 AM  Date/Time Reported: March 15, 2011 8:22 AM   Routine Urinalysis   Color: yellow Appearance: Clear Glucose: negative   (Normal Range: Negative) Bilirubin: 1+   (Normal Range: Negative) Ketone: negative   (Normal Range: Negative) Spec. Gravity: >=1.030   (Normal Range: 1.003-1.035) Blood: trace-intact   (Normal Range: Negative) pH: 5.5   (Normal Range: 5.0-8.0) Protein: negative   (Normal Range: Negative) Urobilinogen: 0.2   (Normal Range: 0-1) Nitrite: negative   (Normal Range: Negative) Leukocyte Esterace: negative   (Normal Range: Negative)    Urine HCG: negative

## 2011-08-05 NOTE — Progress Notes (Signed)
Summary: Followup Call  Notified patient of lab results.  She feels somewhat better.  Awaiting urine culture results. Donna Christen MD  March 16, 2011 9:36 AM   Patient called reporting vomiting for 2 days. After talking with Dr. Orson Aloe he advised that she stop the antibiotic. Call back if still having N/V by tomorrow or see PCP. Patient verbalized understanding.  Lajean Saver RN  March 19, 2011 2:39 PM

## 2012-05-02 ENCOUNTER — Other Ambulatory Visit: Payer: Self-pay | Admitting: Family Medicine

## 2013-08-23 ENCOUNTER — Other Ambulatory Visit: Payer: BC Managed Care – PPO

## 2014-11-01 ENCOUNTER — Encounter: Payer: Self-pay | Admitting: *Deleted

## 2014-11-01 ENCOUNTER — Emergency Department
Admission: EM | Admit: 2014-11-01 | Discharge: 2014-11-01 | Disposition: A | Discharge status: Home / Self Care | Payer: BLUE CROSS/BLUE SHIELD | Source: Home / Self Care | Attending: Emergency Medicine | Admitting: Emergency Medicine

## 2014-11-01 DIAGNOSIS — J208 Acute bronchitis due to other specified organisms: Secondary | ICD-10-CM

## 2014-11-01 HISTORY — DX: Unspecified asthma, uncomplicated: J45.909

## 2014-11-01 MED ORDER — AZITHROMYCIN 250 MG PO TABS: 250.0000 mg | ORAL_TABLET | Freq: Every day | ORAL | Status: DC

## 2014-11-01 NOTE — Discharge Instructions (Signed)

## 2014-11-01 NOTE — ED Provider Notes (Signed)
CSN: [id number]     Arrival date & time 11/01/14  [id number] History   First MD Initiated Contact with Patient 11/01/14 [phone]     Chief Complaint  Patient presents with  . Hoarse   (Consider location/radiation/quality/duration/timing/severity/associated sxs/prior Treatment) Patient is a 24 y.o. female presenting with cough. The history is provided by the patient. No language interpreter was used.  Cough Cough characteristics:  Productive Sputum characteristics:  Nondescript Severity:  Moderate Onset quality:  Gradual Duration:  5 days Timing:  Constant Progression:  Worsening Chronicity:  New Smoker: no   Context: upper respiratory infection   Relieved by:  Nothing Worsened by:  Nothing tried Ineffective treatments:  None tried Associated symptoms: sinus congestion   Risk factors: no recent infection     Past Medical History  Diagnosis Date  . Asthma    History reviewed. No pertinent past surgical history. Family History  Problem Relation Age of Onset  . Hypertension Mother   . Diabetes Father   . Hypertension Father    History  Substance Use Topics  . Smoking status: Never Smoker   . Smokeless tobacco: Not on file  . Alcohol Use: No   OB History    No data available     Review of Systems  Respiratory: Positive for cough.   All other systems reviewed and are negative.   Allergies  Bactrim; Codeine; and Tramadol  Home Medications   Prior to Admission medications   Medication Sig Start Date End Date Taking? Authorizing Provider  azithromycin (ZITHROMAX) 250 MG tablet Take 1 tablet (250 mg total) by mouth daily. Take first 2 tablets together, then 1 every day until finished. 11/01/14   Elson AreasLeslie K Sofia, PA-C  Levonorgestrel-Ethinyl Estradiol (SEASONIQUE) 0.15-0.03 &0.01 MG tablet Take 1 tablet by mouth daily. 05/23/11   Agapito Gamesatherine D Metheney, MD  rizatriptan (MAXALT-MLT) 10 MG disintegrating tablet Take 1 tablet (10 mg total) by mouth as needed for migraine. May repeat in  2 hours if needed 02/18/11 02/18/12  Scot JunKaren E Bowen, DO   BP 102/64 mmHg  Pulse 81  Temp(Src) 98.8 F (37.1 C) (Oral)  Resp 16  Ht 5\' 3"  (1.6 m)  Wt 135 lb (61.236 kg)  BMI 23.92 kg/m2  SpO2 100%  LMP 10/09/2014 Physical Exam  Constitutional: She appears well-developed and well-nourished.  HENT:  Head: Normocephalic.  Right Ear: External ear normal.  Left Ear: External ear normal.  Nose: Nose normal.  Mouth/Throat: Oropharynx is clear and moist.  Eyes: Conjunctivae are normal. Pupils are equal, round, and reactive to light.  Neck: Normal range of motion.  Cardiovascular: Normal rate and normal heart sounds.   Pulmonary/Chest: Effort normal and breath sounds normal.  Abdominal: Soft.  Musculoskeletal: Normal range of motion.  Neurological: She is alert.  Skin: Skin is warm.  Psychiatric: She has a normal mood and affect.  Nursing note and vitals reviewed.   ED Course  Procedures (including critical care time) Labs Review Labs Reviewed - No data to display  Imaging Review No results found.   MDM   1. Acute bronchitis due to other specified organisms    zithromax Salt water gargles lozenges    Elson AreasLeslie K Sofia, PA-C 11/01/14 1003

## 2014-11-01 NOTE — ED Notes (Signed)
Pt c/o hoarseness, nonproductive cough, and chest tightness x 5 days, with temp around 100.0.

## 2015-09-03 HISTORY — PX: SINUS EXPLORATION: SHX5214

## 2017-09-02 NOTE — L&D Delivery Note (Signed)
Delivery Note At 2:21 PM a viable and healthy female was delivered via Vaginal, Spontaneous (Presentation: Left occiput ; Anterior ).  APGAR: 9, 9; weight pending .   Placenta status: spontaneous, intact.  Cord: 3V with wrist cord x 1 Anesthesia:  Epidural Episiotomy: None Lacerations: 2nd degree Suture Repair: 3.0 vicryl Est. Blood Loss (mL):  378  Mom to postpartum.  Baby to Couplet care / Skin to Skin.  Waynard Reeds 06/23/2018, 2:57 PM

## 2017-12-12 LAB — OB RESULTS CONSOLE GC/CHLAMYDIA
Chlamydia: NEGATIVE
GC PROBE AMP, GENITAL: NEGATIVE

## 2018-01-02 LAB — OB RESULTS CONSOLE GC/CHLAMYDIA
Chlamydia: NEGATIVE
GC PROBE AMP, GENITAL: NEGATIVE

## 2018-01-02 LAB — OB RESULTS CONSOLE HEPATITIS B SURFACE ANTIGEN
HEP B S AG: NEGATIVE
Hepatitis B Surface Ag: NEGATIVE

## 2018-01-02 LAB — OB RESULTS CONSOLE RUBELLA ANTIBODY, IGM
Rubella: NON-IMMUNE/NOT IMMUNE
Rubella: NON-IMMUNE/NOT IMMUNE

## 2018-01-02 LAB — OB RESULTS CONSOLE HIV ANTIBODY (ROUTINE TESTING)
HIV: NONREACTIVE
HIV: NONREACTIVE

## 2018-01-02 LAB — OB RESULTS CONSOLE ANTIBODY SCREEN: Antibody Screen: NEGATIVE

## 2018-01-02 LAB — OB RESULTS CONSOLE ABO/RH: RH Type: POSITIVE

## 2018-01-02 LAB — OB RESULTS CONSOLE RPR
RPR: NONREACTIVE
RPR: NONREACTIVE

## 2018-06-19 LAB — OB RESULTS CONSOLE GBS: STREP GROUP B AG: NEGATIVE

## 2018-06-21 ENCOUNTER — Inpatient Hospital Stay (EMERGENCY_DEPARTMENT_HOSPITAL)
Admission: AD | Admit: 2018-06-21 | Discharge: 2018-06-21 | Disposition: A | Discharge status: Home / Self Care | Payer: 59 | Source: Ambulatory Visit | Attending: Obstetrics and Gynecology | Admitting: Obstetrics and Gynecology

## 2018-06-21 ENCOUNTER — Encounter (HOSPITAL_COMMUNITY): Payer: Self-pay

## 2018-06-21 DIAGNOSIS — O4703 False labor before 37 completed weeks of gestation, third trimester: Secondary | ICD-10-CM

## 2018-06-21 DIAGNOSIS — Z3A36 36 weeks gestation of pregnancy: Secondary | ICD-10-CM

## 2018-06-21 DIAGNOSIS — O26893 Other specified pregnancy related conditions, third trimester: Secondary | ICD-10-CM

## 2018-06-21 DIAGNOSIS — N898 Other specified noninflammatory disorders of vagina: Secondary | ICD-10-CM | POA: Insufficient documentation

## 2018-06-21 DIAGNOSIS — Z3689 Encounter for other specified antenatal screening: Secondary | ICD-10-CM

## 2018-06-21 LAB — WET PREP, GENITAL
Clue Cells Wet Prep HPF POC: NONE SEEN
SPERM: NONE SEEN
TRICH WET PREP: NONE SEEN
Yeast Wet Prep HPF POC: NONE SEEN

## 2018-06-21 LAB — AMNISURE RUPTURE OF MEMBRANE (ROM) NOT AT ARMC: Amnisure ROM: NEGATIVE

## 2018-06-21 LAB — POCT FERN TEST: POCT FERN TEST: NEGATIVE

## 2018-06-21 NOTE — MAU Note (Signed)
Pt woke up around 1820 and had damp feeling in underwear, clear fluid. Denies bleeding. +FM. Also losing mucous plug.  Some contractions/pressure since then.

## 2018-06-21 NOTE — Discharge Instructions (Signed)
Braxton Hicks Contractions °Contractions of the uterus can occur throughout pregnancy, but they are not always a sign that you are in labor. You may have practice contractions called Braxton Hicks contractions. These false labor contractions are sometimes confused with true labor. °What are Braxton Hicks contractions? °Braxton Hicks contractions are tightening movements that occur in the muscles of the uterus before labor. Unlike true labor contractions, these contractions do not result in opening (dilation) and thinning of the cervix. Toward the end of pregnancy (32-34 weeks), Braxton Hicks contractions can happen more often and may become stronger. These contractions are sometimes difficult to tell apart from true labor because they can be very uncomfortable. You should not feel embarrassed if you go to the hospital with false labor. °Sometimes, the only way to tell if you are in true labor is for your health care provider to look for changes in the cervix. The health care provider will do a physical exam and may monitor your contractions. If you are not in true labor, the exam should show that your cervix is not dilating and your water has not broken. °If there are other health problems associated with your pregnancy, it is completely safe for you to be sent home with false labor. You may continue to have Braxton Hicks contractions until you go into true labor. °How to tell the difference between true labor and false labor °True labor °· Contractions last 30-70 seconds. °· Contractions become very regular. °· Discomfort is usually felt in the top of the uterus, and it spreads to the lower abdomen and low back. °· Contractions do not go away with walking. °· Contractions usually become more intense and increase in frequency. °· The cervix dilates and gets thinner. °False labor °· Contractions are usually shorter and not as strong as true labor contractions. °· Contractions are usually irregular. °· Contractions  are often felt in the front of the lower abdomen and in the groin. °· Contractions may go away when you walk around or change positions while lying down. °· Contractions get weaker and are shorter-lasting as time goes on. °· The cervix usually does not dilate or become thin. °Follow these instructions at home: °· Take over-the-counter and prescription medicines only as told by your health care provider. °· Keep up with your usual exercises and follow other instructions from your health care provider. °· Eat and drink lightly if you think you are going into labor. °· If Braxton Hicks contractions are making you uncomfortable: °? Change your position from lying down or resting to walking, or change from walking to resting. °? Sit and rest in a tub of warm water. °? Drink enough fluid to keep your urine pale yellow. Dehydration may cause these contractions. °? Do slow and deep breathing several times an hour. °· Keep all follow-up prenatal visits as told by your health care provider. This is important. °Contact a health care provider if: °· You have a fever. °· You have continuous pain in your abdomen. °Get help right away if: °· Your contractions become stronger, more regular, and closer together. °· You have fluid leaking or gushing from your vagina. °· You pass blood-tinged mucus (bloody show). °· You have bleeding from your vagina. °· You have low back pain that you never had before. °· You feel your baby’s head pushing down and causing pelvic pressure. °· Your baby is not moving inside you as much as it used to. °Summary °· Contractions that occur before labor are called Braxton   Hicks contractions, false labor, or practice contractions. °· Braxton Hicks contractions are usually shorter, weaker, farther apart, and less regular than true labor contractions. True labor contractions usually become progressively stronger and regular and they become more frequent. °· Manage discomfort from Braxton Hicks contractions by  changing position, resting in a warm bath, drinking plenty of water, or practicing deep breathing. °This information is not intended to replace advice given to you by your health care provider. Make sure you discuss any questions you have with your health care provider. °Document Released: 01/02/2017 Document Revised: 01/02/2017 Document Reviewed: 01/02/2017 °Elsevier Interactive Patient Education © 2018 [employer] ° °

## 2018-06-21 NOTE — MAU Provider Note (Signed)
History   [id number]   Chief Complaint  Patient presents with  . Rupture of Membranes    HPI Brenda Hull is a 27 y.o. female  G1P0 @36 .2 wks here with report of episode of fluid that soaked her underwear and her pants about 2 hrs ago.  Unsure of color. Leaking of fluid has continued but small amt. Pt reports contractions every 4 min for the last hr. She denies vaginal bleeding. Last intercourse was not recent. She reports good fetal movement. All other systems negative.    No LMP recorded. Patient is pregnant.  OB History  Gravida Para Term Preterm AB Living  1            SAB TAB Ectopic Multiple Live Births               # Outcome Date GA Lbr Len/2nd Weight Sex Delivery Anes PTL Lv  1 Current             Past Medical History:  Diagnosis Date  . Asthma     Family History  Problem Relation Age of Onset  . Hypertension Mother   . Diabetes Father   . Hypertension Father     Social History   Socioeconomic History  . Marital status: Married    Spouse name: Not on file  . Number of children: Not on file  . Years of education: Not on file  . Highest education level: Not on file  Occupational History  . Not on file  Social Needs  . Financial resource strain: Not on file  . Food insecurity:    Worry: Not on file    Inability: Not on file  . Transportation needs:    Medical: Not on file    Non-medical: Not on file  Tobacco Use  . Smoking status: Never Smoker  . Smokeless tobacco: Never Used  Substance and Sexual Activity  . Alcohol use: No  . Drug use: No  . Sexual activity: Not on file  Lifestyle  . Physical activity:    Days per week: Not on file    Minutes per session: Not on file  . Stress: Not on file  Relationships  . Social connections:    Talks on phone: Not on file    Gets together: Not on file    Attends religious service: Not on file    Active member of club or organization: Not on file    Attends meetings of clubs or organizations: Not  on file    Relationship status: Not on file  Other Topics Concern  . Not on file  Social History Narrative  . Not on file    Allergies  Allergen Reactions  . Bactrim [Sulfamethoxazole-Trimethoprim]   . Codeine   . Tramadol     No current facility-administered medications on file prior to encounter.    Current Outpatient Medications on File Prior to Encounter  Medication Sig Dispense Refill  . Prenatal Vit-Fe Fumarate-FA (PRENATAL MULTIVITAMIN) TABS tablet Take 1 tablet by mouth daily at 12 noon.    Marland Kitchen azithromycin (ZITHROMAX) 250 MG tablet Take 1 tablet (250 mg total) by mouth daily. Take first 2 tablets together, then 1 every day until finished. 6 tablet 0  . Levonorgestrel-Ethinyl Estradiol (SEASONIQUE) 0.15-0.03 &0.01 MG tablet Take 1 tablet by mouth daily. 3 Package 3  . rizatriptan (MAXALT-MLT) 10 MG disintegrating tablet Take 1 tablet (10 mg total) by mouth as needed for migraine. May repeat in 2 hours if needed  10 tablet 2     Review of Systems  Gastrointestinal: Positive for abdominal pain.  Genitourinary: Positive for vaginal discharge. Negative for vaginal bleeding.     Physical Exam   Vitals:   06/21/18 1937 06/21/18 1939  BP:  125/77  Pulse: 95   Resp: 18   Temp: 98 F (36.7 C)   TempSrc: Oral   SpO2: 100%   Weight: 85 kg   Height: 5\' 3"  (1.6 m)     Physical Exam  Constitutional: She is oriented to person, place, and time. She appears well-developed and well-nourished. No distress.  HENT:  Head: Normocephalic and atraumatic.  Neck: Normal range of motion.  Cardiovascular: Normal rate.  Respiratory: Effort normal. No respiratory distress.  Genitourinary:  Genitourinary Comments: SSE: copious white/yellow thick discharge, no pool, fern neg SVE: closed/50  Musculoskeletal: Normal range of motion.  Neurological: She is alert and oriented to person, place, and time.  Skin: Skin is warm and dry.  Psychiatric: She has a normal mood and affect.  EFM:  130 bpm, mod variability, + accels, no decels Toco: q4  Results for orders placed or performed during the hospital encounter of 06/21/18 (from the past 24 hour(s))  Wet prep, genital     Status: Abnormal   Collection Time: 06/21/18  8:24 PM  Result Value Ref Range   Yeast Wet Prep HPF POC NONE SEEN NONE SEEN   Trich, Wet Prep NONE SEEN NONE SEEN   Clue Cells Wet Prep HPF POC NONE SEEN NONE SEEN   WBC, Wet Prep HPF POC FEW (A) NONE SEEN   Sperm NONE SEEN   POCT fern test     Status: Normal   Collection Time: 06/21/18  8:40 PM  Result Value Ref Range   POCT Fern Test Negative = intact amniotic membranes   Amnisure rupture of membrane (rom)not at [facility]     Status: None   Collection Time: 06/21/18  8:50 PM  Result Value Ref Range   Amnisure ROM NEGATIVE    MAU Course  Procedures  MDM Labs ordered and reviewed. No evidence of SROM, PTL, or infection. Stable for discharge home.  Assessment and Plan   1. [redacted] weeks gestation of pregnancy   2. NST (non-stress test) reactive   3. Preterm uterine contractions in third trimester, antepartum   4. Vaginal discharge during pregnancy in third trimester    Discharge home Follow up at [facility] this week as scheduled PTL precautions  Allergies as of 06/21/2018      Reactions   Bactrim [sulfamethoxazole-trimethoprim]    Codeine    Tramadol       Medication List    STOP taking these medications   azithromycin 250 MG tablet Commonly known as:  ZITHROMAX   Levonorgestrel-Ethinyl Estradiol 0.15-0.03 &0.01 MG tablet Commonly known as:  AMETHIA,CAMRESE   rizatriptan 10 MG disintegrating tablet Commonly known as:  MAXALT-MLT     TAKE these medications   prenatal multivitamin Tabs tablet Take 1 tablet by mouth daily at 12 noon.      Donette Larry, CNM 06/21/2018 9:22 PM

## 2018-06-22 ENCOUNTER — Inpatient Hospital Stay (HOSPITAL_COMMUNITY)
Admission: AD | Admit: 2018-06-22 | Discharge: 2018-06-25 | DRG: 807 | Disposition: A | Discharge status: Home / Self Care | Payer: 59 | Attending: Obstetrics and Gynecology | Admitting: Obstetrics and Gynecology

## 2018-06-22 ENCOUNTER — Encounter (HOSPITAL_COMMUNITY): Payer: Self-pay

## 2018-06-22 ENCOUNTER — Inpatient Hospital Stay (HOSPITAL_COMMUNITY): Payer: 59 | Admitting: Anesthesiology

## 2018-06-22 ENCOUNTER — Other Ambulatory Visit: Payer: Self-pay

## 2018-06-22 DIAGNOSIS — O42913 Preterm premature rupture of membranes, unspecified as to length of time between rupture and onset of labor, third trimester: Principal | ICD-10-CM | POA: Diagnosis present

## 2018-06-22 DIAGNOSIS — Z23 Encounter for immunization: Secondary | ICD-10-CM

## 2018-06-22 DIAGNOSIS — Z3A36 36 weeks gestation of pregnancy: Secondary | ICD-10-CM

## 2018-06-22 LAB — TYPE AND SCREEN
ABO/RH(D): A POS
Antibody Screen: NEGATIVE

## 2018-06-22 LAB — ABO/RH: ABO/RH(D): A POS

## 2018-06-22 LAB — CBC
HCT: 37.6 % (ref 36.0–46.0)
HEMOGLOBIN: 12.8 g/dL (ref 12.0–15.0)
MCH: 30 pg (ref 26.0–34.0)
MCHC: 34 g/dL (ref 30.0–36.0)
MCV: 88.1 fL (ref 80.0–100.0)
NRBC: 0 % (ref 0.0–0.2)
Platelets: 287 10*3/uL (ref 150–400)
RBC: 4.27 MIL/uL (ref 3.87–5.11)
RDW: 14 % (ref 11.5–15.5)
WBC: 10.2 10*3/uL (ref 4.0–10.5)

## 2018-06-22 LAB — POCT FERN TEST: POCT Fern Test: POSITIVE

## 2018-06-22 MED ORDER — BETAMETHASONE SOD PHOS & ACET 6 (3-3) MG/ML IJ SUSP
12.0000 mg | Freq: Once | INTRAMUSCULAR | Status: AC
  Administered 2018-06-22: 12 mg via INTRAMUSCULAR
  Filled 2018-06-22: qty 2

## 2018-06-22 MED ORDER — BETAMETHASONE SOD PHOS & ACET 6 (3-3) MG/ML IJ SUSP
12.0000 mg | Freq: Once | INTRAMUSCULAR | Status: AC
  Administered 2018-06-23: 12 mg via INTRAMUSCULAR
  Filled 2018-06-22: qty 2

## 2018-06-22 MED ORDER — EPHEDRINE 5 MG/ML INJ
10.0000 mg | INTRAVENOUS | Status: DC | PRN
  Filled 2018-06-22: qty 2

## 2018-06-22 MED ORDER — ACETAMINOPHEN 325 MG PO TABS: 650.0000 mg | ORAL_TABLET | ORAL | Status: DC | PRN

## 2018-06-22 MED ORDER — LACTATED RINGERS IV SOLN: 500.0000 mL | Freq: Once | INTRAVENOUS | Status: DC

## 2018-06-22 MED ORDER — LACTATED RINGERS IV SOLN
INTRAVENOUS | Status: DC
  Administered 2018-06-22 – 2018-06-23 (×5): via INTRAVENOUS

## 2018-06-22 MED ORDER — LIDOCAINE HCL (PF) 1 % IJ SOLN
INTRAMUSCULAR | Status: DC | PRN
  Administered 2018-06-22 (×2): 4 mL via EPIDURAL

## 2018-06-22 MED ORDER — ONDANSETRON HCL 4 MG/2ML IJ SOLN
4.0000 mg | Freq: Four times a day (QID) | INTRAMUSCULAR | Status: DC | PRN
  Administered 2018-06-22 – 2018-06-23 (×2): 4 mg via INTRAVENOUS
  Filled 2018-06-22 (×2): qty 2

## 2018-06-22 MED ORDER — LACTATED RINGERS IV SOLN: 500.0000 mL | INTRAVENOUS | Status: DC | PRN

## 2018-06-22 MED ORDER — TERBUTALINE SULFATE 1 MG/ML IJ SOLN
0.2500 mg | Freq: Once | INTRAMUSCULAR | Status: DC | PRN
  Filled 2018-06-22: qty 1

## 2018-06-22 MED ORDER — SOD CITRATE-CITRIC ACID 500-334 MG/5ML PO SOLN
30.0000 mL | ORAL | Status: DC | PRN
  Filled 2018-06-22: qty 15

## 2018-06-22 MED ORDER — FENTANYL 2.5 MCG/ML BUPIVACAINE 1/10 % EPIDURAL INFUSION (WH - ANES)
14.0000 mL/h | INTRAMUSCULAR | Status: DC | PRN
  Administered 2018-06-22 – 2018-06-23 (×3): 14 mL/h via EPIDURAL
  Filled 2018-06-22 (×3): qty 100

## 2018-06-22 MED ORDER — PHENYLEPHRINE 40 MCG/ML (10ML) SYRINGE FOR IV PUSH (FOR BLOOD PRESSURE SUPPORT)
80.0000 ug | PREFILLED_SYRINGE | INTRAVENOUS | Status: DC | PRN
  Filled 2018-06-22: qty 10
  Filled 2018-06-22: qty 5

## 2018-06-22 MED ORDER — PHENYLEPHRINE 40 MCG/ML (10ML) SYRINGE FOR IV PUSH (FOR BLOOD PRESSURE SUPPORT)
80.0000 ug | PREFILLED_SYRINGE | INTRAVENOUS | Status: DC | PRN
  Filled 2018-06-22: qty 5

## 2018-06-22 MED ORDER — DIPHENHYDRAMINE HCL 50 MG/ML IJ SOLN: 12.5000 mg | INTRAMUSCULAR | Status: DC | PRN

## 2018-06-22 MED ORDER — LIDOCAINE HCL (PF) 1 % IJ SOLN
30.0000 mL | INTRAMUSCULAR | Status: AC | PRN
  Administered 2018-06-23: 30 mL via SUBCUTANEOUS
  Filled 2018-06-22 (×2): qty 30

## 2018-06-22 MED ORDER — OXYTOCIN 40 UNITS IN LACTATED RINGERS INFUSION - SIMPLE MED
2.5000 [IU]/h | INTRAVENOUS | Status: DC
  Filled 2018-06-22: qty 1000

## 2018-06-22 MED ORDER — FLEET ENEMA 7-19 GM/118ML RE ENEM: 1.0000 | ENEMA | RECTAL | Status: DC | PRN

## 2018-06-22 MED ORDER — OXYTOCIN BOLUS FROM INFUSION
500.0000 mL | Freq: Once | INTRAVENOUS | Status: AC
  Administered 2018-06-23: 500 mL via INTRAVENOUS

## 2018-06-22 MED ORDER — OXYTOCIN 40 UNITS IN LACTATED RINGERS INFUSION - SIMPLE MED
1.0000 m[IU]/min | INTRAVENOUS | Status: DC
  Administered 2018-06-22: 2 m[IU]/min via INTRAVENOUS
  Filled 2018-06-22: qty 1000

## 2018-06-22 NOTE — Anesthesia Preprocedure Evaluation (Signed)
Anesthesia Evaluation  Patient identified by MRN, date of birth, ID band Patient awake    Reviewed: Allergy & Precautions, Patient's Chart, lab work & pertinent test results  History of Anesthesia Complications Negative for: history of anesthetic complications  Airway Mallampati: II  TM Distance: >3 FB Neck ROM: Full    Dental no notable dental hx. (+) Teeth Intact   Pulmonary asthma ,    Pulmonary exam normal breath sounds clear to auscultation       Cardiovascular negative cardio ROS Normal cardiovascular exam Rhythm:Regular Rate:Normal     Neuro/Psych negative neurological ROS  negative psych ROS   GI/Hepatic negative GI ROS, Neg liver ROS,   Endo/Other  negative endocrine ROS  Renal/GU negative Renal ROS  negative genitourinary   Musculoskeletal negative musculoskeletal ROS (+)   Abdominal   Peds negative pediatric ROS (+)  Hematology negative hematology ROS (+)   Anesthesia Other Findings   Reproductive/Obstetrics (+) Pregnancy                             Anesthesia Physical Anesthesia Plan  ASA: II  Anesthesia Plan: Epidural   Post-op Pain Management:    Induction:   PONV Risk Score and Plan: 2 and Treatment may vary due to age or medical condition  Airway Management Planned: Natural Airway  Additional Equipment:   Intra-op Plan:   Post-operative Plan:   Informed Consent: I have reviewed the patients History and Physical, chart, labs and discussed the procedure including the risks, benefits and alternatives for the proposed anesthesia with the patient or authorized representative who has indicated his/her understanding and acceptance.     Plan Discussed with: CRNA  Anesthesia Plan Comments:         Anesthesia Quick Evaluation

## 2018-06-22 NOTE — Anesthesia Procedure Notes (Signed)
Epidural Patient location during procedure: OB Start time: 06/22/2018 7:27 PM End time: 06/22/2018 7:30 PM  Staffing Anesthesiologist: Kaylyn Layer, MD Performed: anesthesiologist   Preanesthetic Checklist Completed: patient identified, pre-op evaluation, timeout performed, IV checked, risks and benefits discussed and monitors and equipment checked  Epidural Patient position: sitting Prep: site prepped and draped and DuraPrep Patient monitoring: continuous pulse ox, blood pressure, heart rate and cardiac monitor Approach: midline Location: L3-L4 Injection technique: LOR air  Needle:  Needle type: Tuohy  Needle gauge: 17 G Needle length: 9 cm Needle insertion depth: 5 cm Catheter type: closed end flexible Catheter size: 19 Gauge Catheter at skin depth: 10 cm Test dose: negative and Other (1% lidocaine)  Assessment Events: blood not aspirated, injection not painful, no injection resistance, negative IV test and no paresthesia  Additional Notes Patient identified. Risks, benefits, and alternatives discussed with patient including but not limited to bleeding, infection, nerve damage, paralysis, failed block, incomplete pain control, headache, blood pressure changes, nausea, vomiting, reactions to medication, itching, and postpartum back pain. Confirmed with bedside nurse the patient's most recent platelet count. Confirmed with patient that they are not currently taking any anticoagulation, have any bleeding history, or any family history of bleeding disorders. Patient expressed understanding and wished to proceed. All questions were answered. Sterile technique was used throughout the entire procedure. Please see nursing notes for vital signs. Crisp LOR on first pass. Test dose was given through epidural catheter and negative prior to continuing to dose epidural or start infusion. Warning signs of high block given to the patient including shortness of breath, tingling/numbness in  hands, complete motor block, or any concerning symptoms with instructions to call for help. Patient was given instructions on fall risk and not to get out of bed. All questions and concerns addressed with instructions to call with any issues or inadequate analgesia.  Reason for block:procedure for pain

## 2018-06-22 NOTE — MAU Note (Signed)
Pt presents to MAU with c/o PROM. She has felt leaking of fluid since last night, was seen in MAU over night for rule out rupture, amnisure was negative. Since then has felt continuous leaking of fluid and ctx increased intensity. Pt has had light spotting. +FM

## 2018-06-22 NOTE — Anesthesia Pain Management Evaluation Note (Signed)
  CRNA Pain Management Visit Note  Patient: Brenda Hull, 27 y.o., female  "Hello I am a member of the anesthesia team at [facility]. We have an anesthesia team available at all times to provide care throughout the hospital, including epidural management and anesthesia for C-section. I don't know your plan for the delivery whether it a natural birth, water birth, IV sedation, nitrous supplementation, doula or epidural, but we want to meet your pain goals."   1.Was your pain managed to your expectations on prior hospitalizations?   No prior hospitalizations  2.What is your expectation for pain management during this hospitalization?     Epidural  3.How can we help you reach that goal? epidural  Record the patient's initial score and the patient's pain goal.   Pain: 6  Pain Goal: 8 [facility] wants you to be able to say your pain was always managed very well.  Antionio Negron 06/22/2018

## 2018-06-22 NOTE — H&P (Signed)
27 y.o. [redacted]w[redacted]d  G1P0 comes in c/o LOF, was seen in MAU last night and amnisure neg.  Woke up with another gush this morning.  Otherwise has good fetal movement and no bleeding.  Past Medical History:  Diagnosis Date  . Asthma     Past Surgical History:  Procedure Laterality Date  . SINUS EXPLORATION  09/2015    OB History  Gravida Para Term Preterm AB Living  1            SAB TAB Ectopic Multiple Live Births               # Outcome Date GA Lbr Len/2nd Weight Sex Delivery Anes PTL Lv  1 Current             Social History   Socioeconomic History  . Marital status: Married    Spouse name: Not on file  . Number of children: Not on file  . Years of education: Not on file  . Highest education level: Not on file  Occupational History  . Not on file  Social Needs  . Financial resource strain: Not on file  . Food insecurity:    Worry: Not on file    Inability: Not on file  . Transportation needs:    Medical: Not on file    Non-medical: Not on file  Tobacco Use  . Smoking status: Never Smoker  . Smokeless tobacco: Never Used  Substance and Sexual Activity  . Alcohol use: No  . Drug use: No  . Sexual activity: Not Currently  Lifestyle  . Physical activity:    Days per week: Not on file    Minutes per session: Not on file  . Stress: Not on file  Relationships  . Social connections:    Talks on phone: Not on file    Gets together: Not on file    Attends religious service: Not on file    Active member of club or organization: Not on file    Attends meetings of clubs or organizations: Not on file    Relationship status: Not on file  . Intimate partner violence:    Fear of current or ex partner: Not on file    Emotionally abused: Not on file    Physically abused: Not on file    Forced sexual activity: Not on file  Other Topics Concern  . Not on file  Social History Narrative  . Not on file   Bactrim [sulfamethoxazole-trimethoprim]; Codeine; and Tramadol    Prenatal  Transfer Tool  Maternal Diabetes: No Genetic Screening: Normal Maternal Ultrasounds/Referrals: Normal Fetal Ultrasounds or other Referrals:  None Maternal Substance Abuse:  No Significant Maternal Medications:  None Significant Maternal Lab Results: Lab values include: Group B Strep negative  Other PNC: uncomplicated.    Vitals:   06/22/18 1222 06/22/18 1313 06/22/18 1402 06/22/18 1433  BP: 126/75 123/75 (!) 133/91 124/75  Pulse: 94 85 97 97  Resp:      Temp: 98.1 F (36.7 C)     TempSrc: Oral     SpO2:      Weight:      Height:        Lungs/Cor:  NAD Abdomen:  soft, gravid Ex:  no cords, erythema SVE:  1/50/-2 FHTs:  135, good STV, NST R; Cat 1 tracing. Toco:  q 2-5   A/P   Admitted with PPROM  Betamethasone x 1 given in MAU  GBS Neg  Pitocin 2x2  Other routine  care  [place], [place]

## 2018-06-22 NOTE — MAU Note (Signed)
Urine sent to lab 

## 2018-06-23 ENCOUNTER — Encounter (HOSPITAL_COMMUNITY): Payer: Self-pay

## 2018-06-23 LAB — RPR: RPR: NONREACTIVE

## 2018-06-23 MED ORDER — COCONUT OIL OIL
1.0000 "application " | TOPICAL_OIL | Status: DC | PRN
  Administered 2018-06-25: 1 via TOPICAL
  Filled 2018-06-23: qty 120

## 2018-06-23 MED ORDER — ZOLPIDEM TARTRATE 5 MG PO TABS: 5.0000 mg | ORAL_TABLET | Freq: Every evening | ORAL | Status: DC | PRN

## 2018-06-23 MED ORDER — TETANUS-DIPHTH-ACELL PERTUSSIS 5-2.5-18.5 LF-MCG/0.5 IM SUSP: 0.5000 mL | Freq: Once | INTRAMUSCULAR | Status: DC

## 2018-06-23 MED ORDER — BENZOCAINE-MENTHOL 20-0.5 % EX AERO
1.0000 "application " | INHALATION_SPRAY | CUTANEOUS | Status: DC | PRN
  Administered 2018-06-23: 1 via TOPICAL
  Filled 2018-06-23: qty 56

## 2018-06-23 MED ORDER — ONDANSETRON HCL 4 MG/2ML IJ SOLN: 4.0000 mg | INTRAMUSCULAR | Status: DC | PRN

## 2018-06-23 MED ORDER — DIPHENHYDRAMINE HCL 25 MG PO CAPS: 25.0000 mg | ORAL_CAPSULE | Freq: Four times a day (QID) | ORAL | Status: DC | PRN

## 2018-06-23 MED ORDER — SIMETHICONE 80 MG PO CHEW: 80.0000 mg | CHEWABLE_TABLET | ORAL | Status: DC | PRN

## 2018-06-23 MED ORDER — OXYCODONE-ACETAMINOPHEN 5-325 MG PO TABS: 2.0000 | ORAL_TABLET | ORAL | Status: DC | PRN

## 2018-06-23 MED ORDER — IBUPROFEN 600 MG PO TABS
600.0000 mg | ORAL_TABLET | Freq: Four times a day (QID) | ORAL | Status: DC
  Administered 2018-06-23 – 2018-06-25 (×8): 600 mg via ORAL
  Filled 2018-06-23 (×8): qty 1

## 2018-06-23 MED ORDER — WITCH HAZEL-GLYCERIN EX PADS: 1.0000 "application " | MEDICATED_PAD | CUTANEOUS | Status: DC | PRN

## 2018-06-23 MED ORDER — PNEUMOCOCCAL VAC POLYVALENT 25 MCG/0.5ML IJ INJ
0.5000 mL | INJECTION | INTRAMUSCULAR | Status: DC
  Filled 2018-06-23: qty 0.5

## 2018-06-23 MED ORDER — ACETAMINOPHEN 325 MG PO TABS
650.0000 mg | ORAL_TABLET | ORAL | Status: DC | PRN
  Administered 2018-06-24: 650 mg via ORAL
  Filled 2018-06-23: qty 2

## 2018-06-23 MED ORDER — PRENATAL MULTIVITAMIN CH
1.0000 | ORAL_TABLET | Freq: Every day | ORAL | Status: DC
  Administered 2018-06-24 – 2018-06-25 (×2): 1 via ORAL
  Filled 2018-06-23 (×2): qty 1

## 2018-06-23 MED ORDER — SENNOSIDES-DOCUSATE SODIUM 8.6-50 MG PO TABS
2.0000 | ORAL_TABLET | ORAL | Status: DC
  Administered 2018-06-24 (×2): 2 via ORAL
  Filled 2018-06-23 (×2): qty 2

## 2018-06-23 MED ORDER — ONDANSETRON HCL 4 MG PO TABS: 4.0000 mg | ORAL_TABLET | ORAL | Status: DC | PRN

## 2018-06-23 MED ORDER — DIBUCAINE 1 % RE OINT: 1.0000 "application " | TOPICAL_OINTMENT | RECTAL | Status: DC | PRN

## 2018-06-23 MED ORDER — OXYCODONE-ACETAMINOPHEN 5-325 MG PO TABS: 1.0000 | ORAL_TABLET | ORAL | Status: DC | PRN

## 2018-06-23 NOTE — Lactation Note (Signed)
This note was copied from a baby's chart. Lactation Consultation Note  Patient Name: Brenda Hull [id number]'O Date: 06/23/2018 Reason for consult: Initial assessment;Primapara;1st time breastfeeding;Late-preterm 34-36.6wks  5 hours old late-preterm female who is being exclusively BF by her mother, she's a P1. Mom took BF classes here at [facility] and she already knows how to hand express, when [facility] assisted with hand expression, colostrum was easily flowing out of both breasts; mom voiced she's been leaking since she was [redacted] weeks pregnant. Mom has a Medela DEBP at home.  RN Deven set mom up with a DEBP and a NS# 20. Mom voiced that baby was able to latch with NS # 20 but it felt very snug and uncomfortable. When LC tried the NS # 24 it was a better fit for mom but too big for baby she kept spitting it out. LC put the NS # 20 that mom tried earlier and baby was able to sustain the latch for 5 minutes in football position, however no audible swallows were noted. Mom's nipples are flat, but she voiced that they usually don't look like that, it may be due to areola edema. LC set mom up with breastshells, she brought a nursing bra to the hospital.  Mom understands that baby is just "practicing" at the breast and that he will need to be supplemented and that our first option for supplementation will always be mother's milk. Baby was already been given glucose gel due to low blood sugar. Mom willing to follow feeding plan, she started pumping during lactation consultation. Pump instructions, cleaning and storage were also reviewed. Cluster feeding was discussed as well as LPI policy.  Feeding plan:  1. Encouraged mom to feed baby every 3 hours or sooner if feeding cues are present. Will use NS # 20 for how 2. Mom will pump every 3 hours and at least once at night, a total of 6-8 pumping sessions in 24 hours 3. She'll start wearing her breastshells tomorrow morning, mom aware that is for day time use only  BF  brochure, BF resources and feeding diary were reviewed. Parents reported all questions and concerns were answered, they're both aware of LC services and will call PRN.  Maternal Data Formula Feeding for Exclusion: No Has patient been taught Hand Expression?: Yes Does the patient have breastfeeding experience prior to this delivery?: No  Feeding Feeding Type: Breast Fed  LATCH Score Latch: Repeated attempts needed to sustain latch, nipple held in mouth throughout feeding, stimulation needed to elicit sucking reflex.  Audible Swallowing: None  Type of Nipple: Flat  Comfort (Breast/Nipple): Soft / non-tender  Hold (Positioning): Assistance needed to correctly position infant at breast and maintain latch.  LATCH Score: 5  Interventions Interventions: Breast feeding basics reviewed;Assisted with latch;Skin to skin;Breast massage;Hand express;Breast compression;Adjust position;Support pillows;DEBP  Lactation Tools Discussed/Used Tools: Nipple Shields Nipple shield size: 20 Breast pump type: Double-Electric Breast Pump WIC Program: No Pump Review: Setup, frequency, and cleaning Initiated by:: RN Date initiated:: 06/23/18   Consult Status Consult Status: Follow-up Date: 06/24/18 Follow-up type: In-patient    Cassiopeia Florentino Venetia Constable 06/23/2018, 7:51 PM

## 2018-06-23 NOTE — Progress Notes (Signed)
Patient ID: Brenda Hull, female   DOB: [birth year], 27 y.o.   MRN: [id number]  S: Comfortable with epidural O: AFVSS FHR 120s with accels Cvx 2/80/-2 toco irregular  IUPC placed.  FHR then was 90s. FSE placed, Pt rotated, oxygen placed and iv bolus provided Pt reported acute onset of severe headache at the time of the decel. BP was 90/60. Headache resolved with improvement of BP. FHR returned to baseline of 120 with accels.  Will hold pit for 30 minutes and then restart MVUs not adequate FWB now reassuring

## 2018-06-24 LAB — CBC
HCT: 29.4 % — ABNORMAL LOW (ref 36.0–46.0)
HEMOGLOBIN: 10.4 g/dL — AB (ref 12.0–15.0)
MCH: 31.1 pg (ref 26.0–34.0)
MCHC: 35.4 g/dL (ref 30.0–36.0)
MCV: 88 fL (ref 80.0–100.0)
Platelets: 157 10*3/uL (ref 150–400)
RBC: 3.34 MIL/uL — AB (ref 3.87–5.11)
RDW: 14 % (ref 11.5–15.5)
WBC: 20 10*3/uL — ABNORMAL HIGH (ref 4.0–10.5)
nRBC: 0 % (ref 0.0–0.2)

## 2018-06-24 MED ORDER — MEASLES, MUMPS & RUBELLA VAC ~~LOC~~ INJ
0.5000 mL | INJECTION | Freq: Once | SUBCUTANEOUS | Status: AC
  Filled 2018-06-24: qty 0.5

## 2018-06-24 NOTE — Progress Notes (Signed)
Patient is doing well.  She is ambulating, voiding, tolerating PO.  Pain control is good.  Lochia is appropriate  Vitals:   06/23/18 2122 06/24/18 0051 06/24/18 0631 06/24/18 0632  BP: 115/77 128/77  112/73  Pulse: 76 72 64 (!) 56  Resp: 16 18    Temp: 99.1 F (37.3 C) 98.4 F (36.9 C) 98.4 F (36.9 C)   TempSrc: Axillary Oral Oral   SpO2: 98% 98% 98% 97%  Weight:      Height:        NAD Fundus firm Ext: trace edema b/l  Lab Results  Component Value Date   WBC 20.0 (H) 06/24/2018   HGB 10.4 (L) 06/24/2018   HCT 29.4 (L) 06/24/2018   MCV 88.0 06/24/2018   PLT 157 06/24/2018    --/--/A POS, A POS Performed at [facility], [address] [place], [place] [address]  (10/21 1159)/RNI  A/P 27 y.o. G1P0101 PPD#1 s/p SVD at 36w secondary to PPROM. Routine care.   RNI--MMR before discharge, s/p flu vaccine Expect d/c tomorrow.    [place]

## 2018-06-24 NOTE — Progress Notes (Signed)
MOB was referred for history of depression/anxiety. * Referral screened out by Clinical Social Worker because none of the following criteria appear to apply: ~ History of anxiety/depression during this pregnancy, or of post-partum depression following prior delivery. ~ Diagnosis of anxiety and/or depression within last 3 years OR * MOB's symptoms currently being treated with medication and/or therapy. Please contact the Clinical Social Worker if needs arise, by MOB request, or if MOB scores greater than 9/yes to question 10 on Edinburgh Postpartum Depression Screen.  Brenda Lampi, LCSW Clinical Social Worker  [facility]  [phone]  

## 2018-06-24 NOTE — Anesthesia Postprocedure Evaluation (Signed)
Anesthesia Post Note  Patient: Brenda Hull  Procedure(s) Performed: AN AD HOC LABOR EPIDURAL     Patient location during evaluation: Mother Baby Anesthesia Type: Epidural Level of consciousness: awake and alert Pain management: pain level controlled Vital Signs Assessment: post-procedure vital signs reviewed and stable Respiratory status: spontaneous breathing, nonlabored ventilation and respiratory function stable Cardiovascular status: stable Postop Assessment: no headache, no backache, epidural receding, able to ambulate, adequate PO intake, no apparent nausea or vomiting and patient able to bend at knees Anesthetic complications: no    Last Vitals:  Vitals:   06/24/18 0631 06/24/18 0632  BP:  112/73  Pulse: 64 (!) 56  Resp:    Temp: 36.9 C   SpO2: 98% 97%    Last Pain:  Vitals:   06/24/18 0741  TempSrc:   PainSc: 0-No pain   Pain Goal: Patients Stated Pain Goal: 2 (06/23/18 1400)               [place] Sarinana Hristova

## 2018-06-25 MED ORDER — MEASLES, MUMPS & RUBELLA VAC ~~LOC~~ INJ
0.5000 mL | INJECTION | Freq: Once | SUBCUTANEOUS | Status: AC
  Administered 2018-06-25: 0.5 mL via SUBCUTANEOUS
  Filled 2018-06-25: qty 0.5

## 2018-06-25 NOTE — Lactation Note (Signed)
This note was copied from a baby's chart. Lactation Consultation Note  Patient Name: Brenda Hull EAVWU'J Date: 06/25/2018 Reason for consult: Follow-up assessment;Late-preterm 34-36.6wks;1st time breastfeeding Baby is 103 hours old on double photo tx / 6% weight loss.  Last serum bilirubin 8.5  As LC entered the room, mom and grandmother just finishing changing a wet and stool  Diaper. ( brown )  Grandmother plans to supplement the baby and mom plans to pump with the DEBP.  Mom mentioned after the baby finished feeding there was milk in the NS. Per mom breast are filling fuller and harder in places . LC offered to check for engorgement and noted breast to be soft , no engorgement.  As not had a change to post pump since the Vernona Rieger the [facility] helped her with hand expressing 5 ml.  LC reviewed the importance post pumping to enhance the milk coming in and that the breast milk cleans the gut out better than formula. In the mean time important to hydrate the baby well so the voids and stools will continue to increase.  Per mom soreness has improved and has been using the comfort gels. LC noted the CG's on the counter without the package and threw them out. LC reviewed the care of the comfort gels x6 days and when not using them to rinse place in the package.  Per mom the #24 F is comfortable.   LC reviewed the LC plan - and recommended due to mom being so tired -  Breast feed with #20 NS for 15 -20 mins ( 30 mins max and supplement with EBM or formula  Up to 30 ml. Post pump both breast for 15 -20 mins , save milk for next feeding.  Comfort gels alternating breast shells/ coconut oil with pumping.    Maternal Data    Feeding Feeding Type: Breast Fed  LATCH Score                   Interventions Interventions: Breast feeding basics reviewed  Lactation Tools Discussed/Used Tools: Shells;Pump;Comfort gels;Coconut oil;Nipple Shields Nipple shield size: 20 Shell Type:  Inverted Breast pump type: Double-Electric Breast Pump Pump Review: (per mom the #24 F is comfortable )   Consult Status Consult Status: Follow-up Date: 06/26/18 Follow-up type: In-patient    Matilde Sprang Kypton Eltringham 06/25/2018, 12:33 PM

## 2018-06-25 NOTE — Progress Notes (Signed)
Patient is eating, ambulating, voiding.  Pain control is good.  Vitals:   06/24/18 0051 06/24/18 0631 06/24/18 0632 06/24/18 2323  BP: 128/77  112/73 121/71  Pulse: 72 64 (!) 56 82  Resp: 18   18  Temp: 98.4 F (36.9 C) 98.4 F (36.9 C)  98.2 F (36.8 C)  TempSrc: Oral Oral  Oral  SpO2: 98% 98% 97%   Weight:      Height:        Fundus firm Perineum without swelling.  Lab Results  Component Value Date   WBC 20.0 (H) 06/24/2018   HGB 10.4 (L) 06/24/2018   HCT 29.4 (L) 06/24/2018   MCV 88.0 06/24/2018   PLT 157 06/24/2018    --/--/A POS, A POS Performed at [facility], [address] [place], [place] [address]  (10/21 1159)/RNI  A/P Post partum day 2.  Routine care.  Expect d/c today.  MMR today.    Adarian Bur A

## 2018-06-25 NOTE — Discharge Summary (Signed)
Obstetric Discharge Summary Reason for Admission: rupture of membranes Prenatal Procedures: NST Intrapartum Procedures: spontaneous vaginal delivery Postpartum Procedures: Rubella Ig Complications-Operative and Postpartum: 2 degree perineal laceration Hemoglobin  Date Value Ref Range Status  06/24/2018 10.4 (L) 12.0 - 15.0 g/dL Final   HCT  Date Value Ref Range Status  06/24/2018 29.4 (L) 36.0 - 46.0 % Final    Discharge Diagnoses: Premature labor and delivered  Discharge Information: Date: 06/25/2018 Activity: pelvic rest Diet: routine Medications: Ibuprofen and Iron Condition: stable Instructions: refer to practice specific booklet Discharge to: home Follow-up Information    Brenda Reeds, MD Follow up in 4 week(s).   Specialty:  Obstetrics and Gynecology Contact information: [address] [place] [address] [phone]           Newborn Data: Live born female  Birth Weight: 6 lb 9.5 oz (2990 g) APGAR: 9, 9  Newborn Delivery   Birth date/time:  06/23/2018 14:21:00 Delivery type:  Vaginal, Spontaneous     Home with mother.  Brenda Hull A 06/25/2018, 5:13 AM

## 2018-06-25 NOTE — Progress Notes (Signed)
CSW received and acknowledges consult for EDPS of 9.  Consult screened out due to 9 on EDPS does not warrant a CSW consult.  MOB whom scores are greater than 9/yes to question 10 on Edinburgh Postpartum Depression Screen warrants a CSW consult.   Yocelin Vanlue Boyd-Gilyard, MSW, LCSW Clinical Social Work [phone]  

## 2018-06-25 NOTE — Lactation Note (Signed)
This note was copied from a baby's chart. Lactation Consultation Note Baby 43 hrs old.  Mom has flat nipples, cracked centers, bruised nipples. Very tender. Mom has large pendulous breast. Mom demonstrated several times correctly applying NS #20.  Latched baby in football hold. Baby has small tight mouth. Chin tug and lip flange demonstrated. Baby has labial frenulum, tongue has probable tight frenulum. Tongue has limited mobility.  Baby is jaundice in appearance.  Mom not wearing shells during the night. Encouraged comfort gels after feeding. Mom has slight edema to areolas reverse pressure demonstrated noted very helpful. Encourage to pre-pump w/hand pump prior to application of NS.  Hand expressed 4 ml, noted soften of breast. Inserted into NS w/curve tip syring after noting milk transfer in NS w/feeding. Reviewed LPI information sheet, supplementing, feeding habits, behavior, STS, I&O, milk storage, breast care, and positioning. Baby BF well. Relaxed after feeding. Baby is cluster feeding.  Parents asked a lot of questions. LC answered questions. Encouraged to call for further questions or assistance.   Patient Name: Brenda Hull [id number] Date: 06/25/2018 Reason for consult: Follow-up assessment;Late-preterm 34-36.6wks;1st time breastfeeding   Maternal Data    Feeding Feeding Type: Breast Milk  LATCH Score Latch: Grasps breast easily, tongue down, lips flanged, rhythmical sucking.  Audible Swallowing: Spontaneous and intermittent  Type of Nipple: Flat  Comfort (Breast/Nipple): Engorged, cracked, bleeding, large blisters, severe discomfort  Hold (Positioning): Assistance needed to correctly position infant at breast and maintain latch.  LATCH Score: 6  Interventions Interventions: Breast feeding basics reviewed;Support pillows;Assisted with latch;Position options;Skin to skin;Expressed milk;Breast massage;Hand express;Pre-pump if needed;Comfort gels;Reverse  pressure;Breast compression;Adjust position  Lactation Tools Discussed/Used Tools: Shells;Pump;Comfort gels;Nipple Shields Nipple shield size: 20 Shell Type: Inverted   Consult Status Consult Status: Follow-up Date: 06/25/18(in pm) Follow-up type: In-patient    Charyl Dancer 06/25/2018, 3:19 AM

## 2020-02-12 ENCOUNTER — Encounter: Payer: Self-pay | Admitting: Emergency Medicine

## 2020-02-12 ENCOUNTER — Ambulatory Visit: Payer: Self-pay

## 2020-02-12 ENCOUNTER — Other Ambulatory Visit: Payer: Self-pay

## 2020-02-12 ENCOUNTER — Emergency Department (INDEPENDENT_AMBULATORY_CARE_PROVIDER_SITE_OTHER)
Admission: EM | Admit: 2020-02-12 | Discharge: 2020-02-12 | Disposition: A | Discharge status: Home / Self Care | Payer: 59 | Source: Home / Self Care

## 2020-02-12 ENCOUNTER — Emergency Department (INDEPENDENT_AMBULATORY_CARE_PROVIDER_SITE_OTHER): Payer: 59

## 2020-02-12 DIAGNOSIS — M79602 Pain in left arm: Secondary | ICD-10-CM

## 2020-02-12 DIAGNOSIS — M542 Cervicalgia: Secondary | ICD-10-CM

## 2020-02-12 DIAGNOSIS — M5412 Radiculopathy, cervical region: Secondary | ICD-10-CM

## 2020-02-12 MED ORDER — CYCLOBENZAPRINE HCL 5 MG PO TABS: 5.0000 mg | ORAL_TABLET | Freq: Two times a day (BID) | ORAL | 0 refills | Status: DC | PRN

## 2020-02-12 MED ORDER — METHYLPREDNISOLONE ACETATE 80 MG/ML IJ SUSP
80.0000 mg | Freq: Once | INTRAMUSCULAR | Status: AC
  Administered 2020-02-12: 80 mg via INTRAMUSCULAR

## 2020-02-12 MED ORDER — HYDROCODONE-ACETAMINOPHEN 5-325 MG PO TABS: 1.0000 | ORAL_TABLET | Freq: Four times a day (QID) | ORAL | 0 refills | Status: DC | PRN

## 2020-02-12 NOTE — ED Provider Notes (Signed)
Brenda Hull CARE    CSN: [id number] Arrival date & time: 02/12/20  1108      History   Chief Complaint Chief Complaint  Patient presents with  . Shoulder Pain    HPI Brenda Hull is a 29 y.o. female.   HPI  Brenda Hull is a 29 y.o. female presenting to UC with c/o Left shoulder pain and numbness radiating down her arm.  She developed Left sided neck pain and stiffness a few days ago but the Left arm pain woke her from her sleep last night. Pain is aching and sore, worse with movement. She has taken ibuprofen with minimal relief. She has a daughter who is just under 2yo, who she carries and rocks to sleep in her Left arm. Denies specific injury. She is Right hand dominant.   Past Medical History:  Diagnosis Date  . Asthma     Patient Active Problem List   Diagnosis Date Noted  . Spontaneous vaginal delivery 06/23/2018  . Indication for care in labor or delivery 06/22/2018  . CONSTIPATION, CHRONIC 10/31/2010  . FIBROCYSTIC BREAST DISEASE 08/07/2010  . FLANK PAIN, RIGHT 05/25/2010  . SHOULDER STRAIN, LEFT 05/29/2009  . CONJUNCTIVITIS, ALLERGIC 01/12/2009  . MIGRAINE HEADACHE 09/15/2008  . FATIGUE 09/15/2008  . CARDIAC MURMUR 09/15/2008  . DIARRHEA 07/27/2008  . SINUSITIS- ACUTE-NOS 05/20/2008  . MENORRHAGIA 10/06/2007    Past Surgical History:  Procedure Laterality Date  . SINUS EXPLORATION  09/2015    OB History    Gravida  1   Para  1   Term      Preterm  1   AB      Living  1     SAB      TAB      Ectopic      Multiple  0   Live Births  1            Home Medications    Prior to Admission medications   Medication Sig Start Date End Date Taking? Authorizing Provider  ASHLYNA 0.15-0.03 &0.01 MG tablet Take 1 tablet by mouth daily. 09/14/19  Yes [provider]  cyclobenzaprine (FLEXERIL) 5 MG tablet Take 1-2 tablets (5-10 mg total) by mouth 2 (two) times daily as needed for muscle spasms. 02/12/20   Lurene Shadow,  PA-C  HYDROcodone-acetaminophen (NORCO/VICODIN) 5-325 MG tablet Take 1 tablet by mouth every 6 (six) hours as needed for moderate pain or severe pain. 02/12/20   Lurene Shadow, PA-C  Prenatal Vit-Fe Fumarate-FA (PRENATAL MULTIVITAMIN) TABS tablet Take 1 tablet by mouth daily at 12 noon.    [provider]    Family History Family History  Problem Relation Age of Onset  . Hypertension Mother   . Diabetes Father   . Hypertension Father     Social History Social History   Tobacco Use  . Smoking status: Never Smoker  . Smokeless tobacco: Never Used  Vaping Use  . Vaping Use: Never used  Substance Use Topics  . Alcohol use: No  . Drug use: No     Allergies   Bactrim [sulfamethoxazole-trimethoprim], Codeine, and Tramadol   Review of Systems Review of Systems  Musculoskeletal: Positive for arthralgias, myalgias, neck pain and neck stiffness. Negative for back pain.  Skin: Negative for color change and wound.  Neurological: Positive for weakness and numbness.     Physical Exam Triage Vital Signs ED Triage Vitals  Enc Vitals Group     BP 02/12/20 1124 122/79  Pulse Rate 02/12/20 1124 84     Resp --      Temp --      Temp src --      SpO2 02/12/20 1124 100 %     Weight --      Height --      Head Circumference --      Peak Flow --      Pain Score 02/12/20 1125 9     Pain Loc --      Pain Edu? --      Excl. in [place]? --    No data found.  Updated Vital Signs BP 122/79 (BP Location: Right Arm)   Pulse 84   SpO2 100%   Breastfeeding No   Visual Acuity Right Eye Distance:   Left Eye Distance:   Bilateral Distance:    Right Eye Near:   Left Eye Near:    Bilateral Near:     Physical Exam Vitals and nursing note reviewed.  Constitutional:      Appearance: Normal appearance. She is well-developed.  HENT:     Head: Normocephalic and atraumatic.  Neck:   Cardiovascular:     Rate and Rhythm: Normal rate and regular rhythm.     Pulses:           Radial pulses are 2+ on the left side.  Pulmonary:     Effort: Pulmonary effort is normal.  Musculoskeletal:        General: Tenderness present. Normal range of motion.     Cervical back: Normal range of motion and neck supple. Tenderness present.     Comments: Left arm: full ROM, increased pain with full abduction. Tenderness to Left side cervical muscles and Left upper trapezius.  No bony tenderness to shoulder or elbow. 4/5 grip in Left hand compared to Right.   Skin:    General: Skin is warm and dry.     Capillary Refill: Capillary refill takes less than 2 seconds.  Neurological:     Mental Status: She is alert and oriented to person, place, and time.     Sensory: No sensory deficit.  Psychiatric:        Behavior: Behavior normal.      UC Treatments / Results  Labs (all labs ordered are listed, but only abnormal results are displayed) Labs Reviewed - No data to display  EKG   Radiology DG Cervical Spine Complete  Result Date: 02/12/2020 CLINICAL DATA:  Left-sided neck pain radiating to left arm with left arm weakness 2 days. No injury. EXAM: CERVICAL SPINE - COMPLETE 4+ VIEW COMPARISON:  None. FINDINGS: Vertebral body alignment, heights and disc space heights are normal. Atlantoaxial articulation is normal. No significant neural foraminal narrowing. Prevertebral soft tissues are normal. IMPRESSION: Negative cervical spine radiographs. Electronically Signed   By: Marin Olp M.D.   On: 02/12/2020 12:43    Procedures Procedures (including critical care time)  Medications Ordered in UC Medications  methylPREDNISolone acetate (DEPO-MEDROL) injection 80 mg (80 mg Intramuscular Given 02/12/20 1150)    Initial Impression / Assessment and Plan / UC Course  I have reviewed the triage vital signs and the nursing notes.  Pertinent labs & imaging results that were available during my care of the patient were reviewed by me and considered in my medical decision making (see chart  for details).     Discussed imaging with pt Will tx muscle spasm and strain Encouraged f/u with PCP and Sports Medicine as needed AVS provided  Final Clinical Impressions(s) / UC Diagnoses   Final diagnoses:  Left arm pain  Neck pain on left side  Left cervical radiculopathy     Discharge Instructions      Norco/Vicodin (hydrocodone-acetaminophen) is a narcotic pain medication, do not combine these medications with others containing tylenol. While taking, do not drink alcohol, drive, or perform any other activities that requires focus while taking these medications.   Flexeril (cyclobenzaprine) is a muscle relaxer and may cause drowsiness. Do not drink alcohol, drive, or operate heavy machinery while taking.  Follow up with family medicine or sports medicine in 1 week if not improving.    ED Prescriptions    Medication Sig Dispense Auth. Provider   HYDROcodone-acetaminophen (NORCO/VICODIN) 5-325 MG tablet Take 1 tablet by mouth every 6 (six) hours as needed for moderate pain or severe pain. 6 tablet Doroteo Glassman, Kennth Vanbenschoten O, PA-C   cyclobenzaprine (FLEXERIL) 5 MG tablet Take 1-2 tablets (5-10 mg total) by mouth 2 (two) times daily as needed for muscle spasms. 20 tablet Lurene Shadow, [place]     I have reviewed the PDMP during this encounter.   Lurene Shadow, [place] 02/12/20 1345

## 2020-02-12 NOTE — ED Triage Notes (Signed)
Patient c/o left shoulder pain radiating down arm, tingling down hand to fingers, no injury, no strength in arm, neck pain for several weeks, taken Ibuprofen.

## 2020-02-12 NOTE — Discharge Instructions (Signed)
  Norco/Vicodin (hydrocodone-acetaminophen) is a narcotic pain medication, do not combine these medications with others containing tylenol. While taking, do not drink alcohol, drive, or perform any other activities that requires focus while taking these medications.   Flexeril (cyclobenzaprine) is a muscle relaxer and may cause drowsiness. Do not drink alcohol, drive, or operate heavy machinery while taking.  Follow up with family medicine or sports medicine in 1 week if not improving.

## 2020-11-20 ENCOUNTER — Other Ambulatory Visit: Payer: Self-pay

## 2020-11-20 ENCOUNTER — Encounter (HOSPITAL_BASED_OUTPATIENT_CLINIC_OR_DEPARTMENT_OTHER): Payer: Self-pay | Admitting: Obstetrics & Gynecology

## 2020-11-20 NOTE — Progress Notes (Signed)
YOU ARE SCHEDULED FOR A COVID TEST  11-23-2020 at  215 PM. THIS TEST MUST BE DONE BEFORE SURGERY. GO TO  [address] [place], [place], IT IS APPROXIMATELY 2 MINUTES PAST [employer] ON THE RIGHT AND REMAIN IN YOUR CAR, THIS IS A DRIVE UP TEST. ONCE YOUR COVID TEST IS DONE PLEASE FOLLOW ALL THE QUARANTINE  INSTRUCTIONS GIVEN IN YOUR HANDOUT.      Your procedure is scheduled on 11-27-2020  Report to [facility] AT 530   A. M.   Call this number if you have problems the morning of surgery  :[phone].   OUR ADDRESS IS [address].  WE ARE LOCATED IN [facility].  PLEASE BRING YOUR INSURANCE CARD AND PHOTO ID DAY OF SURGERY.  ONLY ONE PERSON ALLOWED IN FACILITY WAITING AREA.                                     REMEMBER:  DO NOT EAT FOOD, CANDY GUM OR MINTS  AFTER MIDNIGHT . YOU MAY HAVE CLEAR LIQUIDS FROM MIDNIGHT UNTIL 430 AM. NO CLEAR LIQUIDS AFTER 430  AM DAY OF SURGERY  . PLEASE FINISH ENSURE DRINK PER SURGEON ORDER  WHICH NEEDS TO BE COMPLETED AT 430 AM .   YOU MAY  BRUSH YOUR TEETH MORNING OF SURGERY AND RINSE YOUR MOUTH OUT, NO CHEWING GUM CANDY OR MINTS.    CLEAR LIQUID DIET   Foods Allowed                                                                     Foods Excluded  Coffee and tea, regular and decaf                             liquids that you cannot  Plain Jell-O any favor except red or purple                                           see through such as: Fruit ices (not with fruit pulp)                                     milk, soups, orange juice  Iced Popsicles                                    All solid food Carbonated beverages, regular and diet                                    Cranberry, grape and apple juices Sports drinks like Gatorade Lightly seasoned clear broth or consume(fat free) Sugar, honey syrup  Sample Menu Breakfast  Lunch                                      Supper Cranberry juice                    Beef broth                            Chicken broth Jell-O                                     Grape juice                           Apple juice Coffee or tea                        Jell-O                                      Popsicle                                                Coffee or tea                        Coffee or tea  _____________________________________________________________________     TAKE THESE MEDICATIONS MORNING OF SURGERY WITH A SIP OF WATER: ALBUTEROL INHALER PRN/BRING INHALER, ZYRTEC  ONE VISITOR IS ALLOWED IN WAITING ROOM ONLY DAY OF SURGERY.  NO VISITOR MAY SPEND THE NIGHT.  VISITOR ARE ALLOWED TO STAY UNTIL 800 PM.                                    DO NOT WEAR JEWERLY, MAKE UP. DO NOT WEAR LOTIONS, POWDERS, PERFUMES OR DEODORANT. DO NOT SHAVE FOR 24 HOURS PRIOR TO DAY OF SURGERY. MEN MAY SHAVE FACE AND NECK. CONTACTS, GLASSES, OR DENTURES MAY NOT BE WORN TO SURGERY.                                    [facility] IS NOT RESPONSIBLE  FOR ANY BELONGINGS.                                                                    Marland Kitchen           [facility] - Preparing for Surgery Before surgery, you can play an important role.  Because skin is not sterile, your skin needs to be as free of germs as possible.  You can reduce the number of germs on your skin by washing with CHG (chlorahexidine gluconate) soap before surgery.  CHG  is an antiseptic cleaner which kills germs and bonds with the skin to continue killing germs even after washing. Please DO NOT use if you have an allergy to CHG or antibacterial soaps.  If your skin becomes reddened/irritated stop using the CHG and inform your nurse when you arrive at Short Stay. Do not shave (including legs and underarms) for at least 48 hours prior to the first CHG shower.  You may shave your face/neck. Please follow these instructions carefully:  1.  Shower with CHG Soap the night before  surgery and the  morning of Surgery.  2.  If you choose to wash your hair, wash your hair first as usual with your  normal  shampoo.  3.  After you shampoo, rinse your hair and body thoroughly to remove the  shampoo.                            4.  Use CHG as you would any other liquid soap.  You can apply chg directly  to the skin and wash                      Gently with a scrungie or clean washcloth.  5.  Apply the CHG Soap to your body ONLY FROM THE NECK DOWN.   Do not use on face/ open                           Wound or open sores. Avoid contact with eyes, ears mouth and genitals (private parts).                       Wash face,  Genitals (private parts) with your normal soap.             6.  Wash thoroughly, paying special attention to the area where your surgery  will be performed.  7.  Thoroughly rinse your body with warm water from the neck down.  8.  DO NOT shower/wash with your normal soap after using and rinsing off  the CHG Soap.                9.  Pat yourself dry with a clean towel.            10.  Wear clean pajamas.            11.  Place clean sheets on your bed the night of your first shower and do not  sleep with pets. Day of Surgery : Do not apply any lotions/deodorants the morning of surgery.  Please wear clean clothes to the hospital/surgery center.  FAILURE TO FOLLOW THESE INSTRUCTIONS MAY RESULT IN THE CANCELLATION OF YOUR SURGERY PATIENT SIGNATURE_________________________________  NURSE SIGNATURE__________________________________  ________________________________________________________________________                                                        QUESTIONS Annye Asa PRE OP NURSE PHONE [phone]

## 2020-11-20 NOTE — H&P (Signed)
Brenda Hull is an 30 y.o. female  Seen at [facility] 11/17/2020 for preop visit: 30yo G1P1 Pt here for pre op visit. Scheduled for RALTH/BS/TVT/cystoscopy on 11/27/2020 for pelvic pain and menorrhagia concerning for adenomyosis, endometriosis and stress urinary incontinence. LMP- 10/25/2020./dk  Patient reports that she has been on multiple birth control pills, reports that the side effects are very bothersome - horrible migraines, weight issues. She has now been off of birth control x21m, weight is the lowest since high school, also on phenatermine (which she recently held for surgery) Reports 20-30lb weight gain on birth control. Felt sluggish/tired on birth control. She has noticed a big change off of birth control. Does not want to try nexplanon/IUD. Reports history of dyspareunia. Previously discussed with Dr. Junie Bame, considered PT but patient reports she never received a call regarding referral and that she never followed up for pelvic floor PT. Reports pain with deep penetration. Pelvic pain all the time, worse during her period, difficult getting comfortable/walking around during her period. Dull and deep pain in her pelvis all the time. Has heavy menses - periods are monthly most of the time. Bleeding lasts for 5 days. Reports saturating pad q1h.  Desires definitive management, is certain she is done having children.  Does have a h/o constipation, working on diet changes. Reports no pain with BMs  Reports incontinence - urge incontinence and also leaking with coughing and sneezing. Has tried kegels herself, would be interested in surgery for incontinence. Reports no pain with voiding. Reports trying OAB medications as well. Finds stress urinary incontinence most bothersome and desires to proceed with surgery.  Workup: 11/17/2020 urine culture negative 10/31/2020 Hgb 14 07/20/2018 Pap LSIL, HPV Neg, 08/21/2018 ECC benign, Colpo biopsy insufficient for diagnosis, 09/14/2019 Pap NILM,  10/18/2020 Pap NILM 10/05/2020 [place]: report in epic. Uterus measures 6.7 x 3.8 x 3.2 cm. Globular and heterogeneous appearing uterus with diffuse echogenic striations and irregular endometrial-myometrial junction. Focal bulky and heterogeneous appearance of the anterior wall of the uterus. The endometrial echo complex measures 3 mm in width. Apparent blood products within the cervix. . Right ovary/adnexa: Normal size, contour, and echotexture. No mass. The ovary measures 2.6 x 2.6 x 1.9 cm. Volume = 6 mL. Marland Kitchen Left ovary/adnexa: Normal size, contour, and echotexture. No mass. The ovary measures 2.1 x 2.4 x 1.4 cm. Volume = 4 mL. Marland Kitchen Peritoneum: No fluid in the cul-de-sac. Past Medical History:  Diagnosis Date  . Adenomyosis   . Anxiety   . Asthma   . GERD (gastroesophageal reflux disease)   . Heart murmur    mild since birth no cardiologist  . Migraine   . Overactive bladder   . Pelvic pain   . PONV (postoperative nausea and vomiting)    pt wouldlike iv med or nausea patch dos  . Seasonal allergies   . Wears glasses     Past Surgical History:  Procedure Laterality Date  . SINUS EXPLORATION  09/2015    Family History  Problem Relation Age of Onset  . Hypertension Mother   . Diabetes Father   . Hypertension Father     Social History:  reports that she has never smoked. She has never used smokeless tobacco. She reports that she does not drink alcohol and does not use drugs.  Allergies:  Allergies  Allergen Reactions  . Bactrim [Sulfamethoxazole-Trimethoprim] Nausea And Vomiting  . Codeine Nausea And Vomiting  . Tramadol Itching    No medications prior to admission.    Review  of Systems Per HPI all other pertinent ROS neg  Height 5\' 3"  (1.6 m), weight 72.1 kg, last menstrual period 10/25/2020. Physical Exam Pelvic 11/17/2020: Vulva: normal. Urethral Meatus/ Urethra normal meatus, no discharge, and well supported urethra; Negative cough stress test in supine position. patient  reports as soon as she got up almost leaked. Patient than voided, reports she felt completely empty. I&O catheter by me with 50cc residual volume.  Exam 10/31/2020 Chaperone Chaperone: present  Constitutional *General Appearance: healthy-appearing  Head Head: normocephalic  Cardiovascular *Auscultation: RRR, no murmur  Lungs *Respiratory Effort: no accessory muscle usage *Auscultation: clear to auscultation  Abdomen *Inspection/Palpation/Auscultation: non-distended, soft  Female Genitalia Vulva: no masses, no atrophy, no lesions Mons: normal Labia Majora: normal Labia Minora: normal Introitus: normal *Vagina: normal, no discharge, no tenderness, no prolapse *Cervix: grossly normal, no lesions, no discharge, no cervical motion tenderness *Uterus: normal size, normal contour, mobile (Uterus is mildly tender to palpation on exam, but uterosacral ligaments are more tender to palpation on exam then uterus.) *Adnexa/Parametria: no mass palpable, adnexal tenderness bilateral  Assessment/Plan: 30yo G1P1 scheduled for RALTH/BS/TVT/cystoscopy on 11/27/2020 for pelvic pain and menorrhagia concerning for adenomyosis, endometriosis and stress urinary incontinence.  1. Uterine adenomyosis - Suspected based on 11/29/2020 and symptoms of menorrhagia, dyspareunia. Discussed concern for endometriosis as well, therefore plan for robot for route of hysterectomy.  The nature of the procedure was discussed with the patient in detail, using models, diagrams, and educational materials as needed. An informed discussion was held regarding the risks and benefits of surgical intervention. Specifically, the patient was apprised of risks of pain, bleeding requiring blood transfusion, infection requiring antibiotics, injury to nearby organs (bowel, bladder, nerves, blood vessels, ureter), need for laparotomy to complete the operation, or failure to achieve desired results. She was informed of the low but real risk of  these complications, and understands that the alternative is no surgery. An opportunity to ask questions was provided, and all questions were answered to the patient's satisfaction. Patient expresses understanding of these issues, and agrees to proceed with the plan outlined above. The expected post-operative recovery course was discussed with the patient, and post-operative instructions were reviewed.  2. Female stress incontinence - Discussed that patient may had mixed urinary incontience, based on some symptoms of OAB and prior medical management for OAB. Offered referr to [place]. Patient strongly desires TVT for SUI management, understands she may require additional management in the future of OAB. -Provided handouts previously on SUI, OAB, management, offered referral to PT. Patient declines, desires TVT, AUGs hand out of TVT provided. Discussed risks of urinary retention, requiring chronic use of I&O catheters  3. Heart murmur: reports speeds up when she breaths in, slows down when she breaths out. On exam RRR.  4. Scheduled for breast surgery (reduction) in April, patient has discussed with breast surgeon, ok to proceed with hysterectomy first, aware of surgery date  Ignacia Gentzler K Taam-Akelman 11/20/2020, 9:04 PM

## 2020-11-20 NOTE — Progress Notes (Signed)
Spoke w/ via phone for pre-op interview---pt Lab needs dos----     Urine preg          Lab results-----has lab appt 11-23-2020 1315  For cbcb bmp t & s COVID test ------11-23-2020 215 pm Arrive at -------530 am 11-27-2020 NPO after MN NO Solid Food.  Clear liquids from MN until---430 am, drink ensure pre surgery drink at 430 am then npo. Med rec completed Medications to take morning of surgery -----use/bring albuterol inahler, zyrtec Diabetic medication -----n/a Patient instructed to bring photo id and insurance card day of surgery Patient aware to have Driver (ride ) / caregiver  Spouse brandon will stay   for 24 hours after surgery  Patient Special Instructions -----pt given extended stay instructions Pre-Op special Istructions -----none Patient verbalized understanding of instructions that were given at this phone interview. Patient denies shortness of breath, chest pain, fever, cough at this phone interview.

## 2020-11-23 ENCOUNTER — Encounter (HOSPITAL_COMMUNITY)
Admission: RE | Admit: 2020-11-23 | Discharge: 2020-11-23 | Disposition: A | Discharge status: Home / Self Care | Payer: 59 | Source: Ambulatory Visit | Attending: Obstetrics & Gynecology | Admitting: Obstetrics & Gynecology

## 2020-11-23 ENCOUNTER — Other Ambulatory Visit: Payer: Self-pay

## 2020-11-23 ENCOUNTER — Other Ambulatory Visit (HOSPITAL_COMMUNITY)
Admission: RE | Admit: 2020-11-23 | Discharge: 2020-11-23 | Disposition: A | Discharge status: Home / Self Care | Payer: 59 | Source: Ambulatory Visit | Attending: Obstetrics & Gynecology | Admitting: Obstetrics & Gynecology

## 2020-11-23 DIAGNOSIS — Z20822 Contact with and (suspected) exposure to covid-19: Secondary | ICD-10-CM | POA: Insufficient documentation

## 2020-11-23 DIAGNOSIS — Z01812 Encounter for preprocedural laboratory examination: Secondary | ICD-10-CM | POA: Insufficient documentation

## 2020-11-23 LAB — BASIC METABOLIC PANEL
Anion gap: 9 (ref 5–15)
BUN: 13 mg/dL (ref 6–20)
CO2: 22 mmol/L (ref 22–32)
Calcium: 9.7 mg/dL (ref 8.9–10.3)
Chloride: 108 mmol/L (ref 98–111)
Creatinine, Ser: 0.98 mg/dL (ref 0.44–1.00)
GFR, Estimated: >60 mL/min (ref >60)
Glucose, Bld: 83 mg/dL (ref 70–99)
Potassium: 4 mmol/L (ref 3.5–5.1)
Sodium: 139 mmol/L (ref 135–145)

## 2020-11-23 LAB — CBC
HCT: 41.8 % (ref 36.0–46.0)
Hemoglobin: 13.9 g/dL (ref 12.0–15.0)
MCH: 30.2 pg (ref 26.0–34.0)
MCHC: 33.3 g/dL (ref 30.0–36.0)
MCV: 90.7 fL (ref 80.0–100.0)
Platelets: 404 10*3/uL — ABNORMAL HIGH (ref 150–400)
RBC: 4.61 MIL/uL (ref 3.87–5.11)
RDW: 12.7 % (ref 11.5–15.5)
WBC: 10.2 10*3/uL (ref 4.0–10.5)
nRBC: 0 % (ref 0.0–0.2)

## 2020-11-23 LAB — SARS CORONAVIRUS 2 (TAT 6-24 HRS): SARS Coronavirus 2: NEGATIVE

## 2020-11-25 NOTE — Anesthesia Preprocedure Evaluation (Addendum)
Anesthesia Evaluation  Patient identified by MRN, date of birth, ID band  Reviewed: Allergy & Precautions, NPO status , Patient's Chart, lab work & pertinent test results  History of Anesthesia Complications (+) PONV and history of anesthetic complications  Airway Mallampati: II  TM Distance: >3 FB     Dental   Pulmonary asthma ,    breath sounds clear to auscultation       Cardiovascular Exercise Tolerance: Good negative cardio ROS   Rhythm:Regular Rate:Normal     Neuro/Psych  Headaches, Anxiety    GI/Hepatic Neg liver ROS, GERD  ,  Endo/Other  negative endocrine ROS  Renal/GU negative Renal ROSK+ 4.0     Musculoskeletal negative musculoskeletal ROS (+)   Abdominal   Peds  Hematology Hgb 13.9   Anesthesia Other Findings   Reproductive/Obstetrics negative OB ROS                            Anesthesia Physical Anesthesia Plan  ASA: II  Anesthesia Plan: General   Post-op Pain Management:    Induction: Intravenous  PONV Risk Score and Plan: 4 or greater and Treatment may vary due to age or medical condition, Aprepitant, Ondansetron, Midazolam, Dexamethasone and Scopolamine patch - Pre-op  Airway Management Planned: Oral ETT  Additional Equipment: None  Intra-op Plan:   Post-operative Plan: Extubation in OR  Informed Consent:     Dental advisory given  Plan Discussed with: CRNA and Anesthesiologist  Anesthesia Plan Comments: (Robotic hysterectomy forpelvic pain and menorrhagia concerning for adenomyosis, endometriosis and stress urinary incontinence  GA w Lidocaine infusion + 0.4 mg/kg  ketamine)      Anesthesia Quick Evaluation

## 2020-11-27 ENCOUNTER — Other Ambulatory Visit: Payer: Self-pay

## 2020-11-27 ENCOUNTER — Encounter (HOSPITAL_BASED_OUTPATIENT_CLINIC_OR_DEPARTMENT_OTHER)
Admission: RE | Disposition: A | Discharge status: Home / Self Care | Payer: Self-pay | Source: Home / Self Care | Attending: Obstetrics & Gynecology

## 2020-11-27 ENCOUNTER — Ambulatory Visit (HOSPITAL_BASED_OUTPATIENT_CLINIC_OR_DEPARTMENT_OTHER): Payer: 59 | Admitting: Anesthesiology

## 2020-11-27 ENCOUNTER — Encounter (HOSPITAL_BASED_OUTPATIENT_CLINIC_OR_DEPARTMENT_OTHER): Payer: Self-pay | Admitting: Obstetrics & Gynecology

## 2020-11-27 ENCOUNTER — Ambulatory Visit (HOSPITAL_BASED_OUTPATIENT_CLINIC_OR_DEPARTMENT_OTHER)
Admission: RE | Admit: 2020-11-27 | Discharge: 2020-11-27 | Disposition: A | Discharge status: Home / Self Care | Payer: 59 | Attending: Obstetrics & Gynecology | Admitting: Obstetrics & Gynecology

## 2020-11-27 DIAGNOSIS — N8111 Cystocele, midline: Secondary | ICD-10-CM | POA: Insufficient documentation

## 2020-11-27 DIAGNOSIS — K219 Gastro-esophageal reflux disease without esophagitis: Secondary | ICD-10-CM | POA: Diagnosis not present

## 2020-11-27 DIAGNOSIS — Z885 Allergy status to narcotic agent status: Secondary | ICD-10-CM | POA: Insufficient documentation

## 2020-11-27 DIAGNOSIS — Z8249 Family history of ischemic heart disease and other diseases of the circulatory system: Secondary | ICD-10-CM | POA: Insufficient documentation

## 2020-11-27 DIAGNOSIS — Z833 Family history of diabetes mellitus: Secondary | ICD-10-CM | POA: Insufficient documentation

## 2020-11-27 DIAGNOSIS — R102 Pelvic and perineal pain: Secondary | ICD-10-CM | POA: Diagnosis not present

## 2020-11-27 DIAGNOSIS — Z90710 Acquired absence of both cervix and uterus: Secondary | ICD-10-CM | POA: Diagnosis present

## 2020-11-27 DIAGNOSIS — N393 Stress incontinence (female) (male): Secondary | ICD-10-CM | POA: Insufficient documentation

## 2020-11-27 DIAGNOSIS — Z881 Allergy status to other antibiotic agents status: Secondary | ICD-10-CM | POA: Insufficient documentation

## 2020-11-27 DIAGNOSIS — Z888 Allergy status to other drugs, medicaments and biological substances status: Secondary | ICD-10-CM | POA: Insufficient documentation

## 2020-11-27 HISTORY — DX: Stress incontinence (female) (male): N39.3

## 2020-11-27 HISTORY — DX: Nausea with vomiting, unspecified: R11.2

## 2020-11-27 HISTORY — DX: Other specified postprocedural states: Z98.890

## 2020-11-27 HISTORY — DX: Migraine, unspecified, not intractable, without status migrainosus: G43.909

## 2020-11-27 HISTORY — DX: Pelvic and perineal pain: R10.2

## 2020-11-27 HISTORY — DX: Overactive bladder: N32.81

## 2020-11-27 HISTORY — DX: Presence of spectacles and contact lenses: Z97.3

## 2020-11-27 HISTORY — DX: Cardiac murmur, unspecified: R01.1

## 2020-11-27 HISTORY — PX: CYSTOSCOPY: SHX5120

## 2020-11-27 HISTORY — DX: Other seasonal allergic rhinitis: J30.2

## 2020-11-27 HISTORY — PX: BLADDER SUSPENSION: SHX72

## 2020-11-27 HISTORY — PX: ROBOTIC ASSISTED TOTAL HYSTERECTOMY: SHX6085

## 2020-11-27 HISTORY — DX: Adenomyosis of the uterus: N80.03

## 2020-11-27 HISTORY — DX: Endometriosis of uterus: N80.0

## 2020-11-27 HISTORY — DX: Anxiety disorder, unspecified: F41.9

## 2020-11-27 HISTORY — DX: Gastro-esophageal reflux disease without esophagitis: K21.9

## 2020-11-27 LAB — POCT PREGNANCY, URINE: Preg Test, Ur: NEGATIVE

## 2020-11-27 LAB — TYPE AND SCREEN
ABO/RH(D): A POS
Antibody Screen: NEGATIVE

## 2020-11-27 SURGERY — HYSTERECTOMY, TOTAL, ROBOT-ASSISTED
Anesthesia: General | Site: Vagina

## 2020-11-27 MED ORDER — PHENYLEPHRINE 40 MCG/ML (10ML) SYRINGE FOR IV PUSH (FOR BLOOD PRESSURE SUPPORT)
PREFILLED_SYRINGE | INTRAVENOUS | Status: AC
  Filled 2020-11-27: qty 10

## 2020-11-27 MED ORDER — ONDANSETRON HCL 4 MG/2ML IJ SOLN
INTRAMUSCULAR | Status: DC | PRN
  Administered 2020-11-27: 4 mg via INTRAVENOUS

## 2020-11-27 MED ORDER — POVIDONE-IODINE 10 % EX SWAB: 2.0000 "application " | Freq: Once | CUTANEOUS | Status: DC

## 2020-11-27 MED ORDER — APREPITANT 40 MG PO CAPS
ORAL_CAPSULE | ORAL | Status: AC
  Filled 2020-11-27: qty 1

## 2020-11-27 MED ORDER — SENNOSIDES-DOCUSATE SODIUM 8.6-50 MG PO TABS
1.0000 | ORAL_TABLET | Freq: Every day | ORAL | Status: DC
  Filled 2020-11-27: qty 1

## 2020-11-27 MED ORDER — IBUPROFEN 800 MG PO TABS: 800.0000 mg | ORAL_TABLET | Freq: Three times a day (TID) | ORAL | 0 refills | Status: AC | PRN

## 2020-11-27 MED ORDER — OXYCODONE HCL 5 MG PO TABS
ORAL_TABLET | ORAL | Status: AC
  Filled 2020-11-27: qty 1

## 2020-11-27 MED ORDER — LIDOCAINE 2% (20 MG/ML) 5 ML SYRINGE
INTRAMUSCULAR | Status: AC
  Filled 2020-11-27: qty 10

## 2020-11-27 MED ORDER — SCOPOLAMINE 1 MG/3DAYS TD PT72
1.0000 | MEDICATED_PATCH | TRANSDERMAL | Status: DC
  Administered 2020-11-27: 1.5 mg via TRANSDERMAL

## 2020-11-27 MED ORDER — OXYCODONE HCL 5 MG PO TABS
5.0000 mg | ORAL_TABLET | ORAL | Status: DC | PRN
  Administered 2020-11-27 (×3): 5 mg via ORAL

## 2020-11-27 MED ORDER — PROPOFOL 10 MG/ML IV BOLUS
INTRAVENOUS | Status: AC
  Filled 2020-11-27: qty 40

## 2020-11-27 MED ORDER — ROCURONIUM BROMIDE 100 MG/10ML IV SOLN
INTRAVENOUS | Status: DC | PRN
  Administered 2020-11-27: 60 mg via INTRAVENOUS
  Administered 2020-11-27: 5 mg via INTRAVENOUS
  Administered 2020-11-27: 10 mg via INTRAVENOUS

## 2020-11-27 MED ORDER — FENTANYL CITRATE (PF) 250 MCG/5ML IJ SOLN
INTRAMUSCULAR | Status: AC
  Filled 2020-11-27: qty 5

## 2020-11-27 MED ORDER — DIPHENHYDRAMINE HCL 50 MG/ML IJ SOLN: 25.0000 mg | Freq: Four times a day (QID) | INTRAMUSCULAR | Status: DC | PRN

## 2020-11-27 MED ORDER — KETOROLAC TROMETHAMINE 30 MG/ML IJ SOLN
INTRAMUSCULAR | Status: AC
  Filled 2020-11-27: qty 1

## 2020-11-27 MED ORDER — KETOROLAC TROMETHAMINE 15 MG/ML IJ SOLN: 15.0000 mg | INTRAMUSCULAR | Status: DC

## 2020-11-27 MED ORDER — PHENAZOPYRIDINE HCL 100 MG PO TABS: 100.0000 mg | ORAL_TABLET | Freq: Three times a day (TID) | ORAL | 0 refills | Status: AC | PRN

## 2020-11-27 MED ORDER — MIDAZOLAM HCL 2 MG/2ML IJ SOLN
INTRAMUSCULAR | Status: DC | PRN
  Administered 2020-11-27: 1 mg via INTRAVENOUS

## 2020-11-27 MED ORDER — CEFAZOLIN SODIUM-DEXTROSE 2-4 GM/100ML-% IV SOLN
2.0000 g | INTRAVENOUS | Status: AC
  Administered 2020-11-27: 2 g via INTRAVENOUS

## 2020-11-27 MED ORDER — ENSURE PRE-SURGERY PO LIQD: 296.0000 mL | Freq: Once | ORAL | Status: DC

## 2020-11-27 MED ORDER — CEPHALEXIN 500 MG PO CAPS: 500.0000 mg | ORAL_CAPSULE | Freq: Four times a day (QID) | ORAL | 0 refills | Status: AC

## 2020-11-27 MED ORDER — GABAPENTIN 300 MG PO CAPS
300.0000 mg | ORAL_CAPSULE | ORAL | Status: AC
  Administered 2020-11-27: 300 mg via ORAL

## 2020-11-27 MED ORDER — SODIUM CHLORIDE (PF) 0.9 % IJ SOLN
INTRAMUSCULAR | Status: DC | PRN
  Administered 2020-11-27: 30 mL

## 2020-11-27 MED ORDER — ACETAMINOPHEN 500 MG PO TABS
1000.0000 mg | ORAL_TABLET | ORAL | Status: AC
  Administered 2020-11-27: 1000 mg via ORAL

## 2020-11-27 MED ORDER — LIDOCAINE HCL (CARDIAC) PF 100 MG/5ML IV SOSY
PREFILLED_SYRINGE | INTRAVENOUS | Status: DC | PRN
  Administered 2020-11-27: 50 mg via INTRAVENOUS

## 2020-11-27 MED ORDER — APREPITANT 40 MG PO CAPS
40.0000 mg | ORAL_CAPSULE | Freq: Once | ORAL | Status: AC
  Administered 2020-11-27: 40 mg via ORAL

## 2020-11-27 MED ORDER — OXYCODONE HCL 5 MG PO TABS
ORAL_TABLET | ORAL | Status: AC
  Filled 2020-11-27: qty 2

## 2020-11-27 MED ORDER — ACETAMINOPHEN 500 MG PO TABS
1000.0000 mg | ORAL_TABLET | Freq: Four times a day (QID) | ORAL | Status: DC
  Administered 2020-11-27: 1000 mg via ORAL

## 2020-11-27 MED ORDER — PROPOFOL 10 MG/ML IV BOLUS
INTRAVENOUS | Status: DC | PRN
  Administered 2020-11-27: 40 mg via INTRAVENOUS
  Administered 2020-11-27: 150 mg via INTRAVENOUS

## 2020-11-27 MED ORDER — FENTANYL CITRATE (PF) 100 MCG/2ML IJ SOLN
INTRAMUSCULAR | Status: AC
  Filled 2020-11-27: qty 2

## 2020-11-27 MED ORDER — ONDANSETRON HCL 4 MG/2ML IJ SOLN: 4.0000 mg | Freq: Four times a day (QID) | INTRAMUSCULAR | Status: DC | PRN

## 2020-11-27 MED ORDER — LACTATED RINGERS IV SOLN
INTRAVENOUS | Status: DC
  Administered 2020-11-27: 1000 mL via INTRAVENOUS

## 2020-11-27 MED ORDER — ACETAMINOPHEN 500 MG PO TABS
ORAL_TABLET | ORAL | Status: AC
  Filled 2020-11-27: qty 2

## 2020-11-27 MED ORDER — ROPIVACAINE HCL 5 MG/ML IJ SOLN
INTRAMUSCULAR | Status: DC | PRN
  Administered 2020-11-27: 30 mL

## 2020-11-27 MED ORDER — FENTANYL CITRATE (PF) 100 MCG/2ML IJ SOLN
25.0000 ug | INTRAMUSCULAR | Status: DC | PRN
  Administered 2020-11-27: 25 ug via INTRAVENOUS
  Administered 2020-11-27: 50 ug via INTRAVENOUS
  Administered 2020-11-27: 25 ug via INTRAVENOUS

## 2020-11-27 MED ORDER — MIDAZOLAM HCL 2 MG/2ML IJ SOLN
INTRAMUSCULAR | Status: AC
  Filled 2020-11-27: qty 2

## 2020-11-27 MED ORDER — ESTRADIOL 0.1 MG/GM VA CREA
TOPICAL_CREAM | VAGINAL | Status: DC | PRN
  Administered 2020-11-27: 1 via VAGINAL

## 2020-11-27 MED ORDER — LIDOCAINE HCL (PF) 2 % IJ SOLN
INTRAMUSCULAR | Status: DC | PRN
  Administered 2020-11-27: 1 mg/kg/h via INTRADERMAL

## 2020-11-27 MED ORDER — SENNOSIDES-DOCUSATE SODIUM 8.6-50 MG PO TABS: 1.0000 | ORAL_TABLET | Freq: Every day | ORAL | 1 refills | Status: AC

## 2020-11-27 MED ORDER — LACTATED RINGERS IV SOLN: INTRAVENOUS | Status: DC

## 2020-11-27 MED ORDER — GLYCOPYRROLATE PF 0.2 MG/ML IJ SOSY
PREFILLED_SYRINGE | INTRAMUSCULAR | Status: AC
  Filled 2020-11-27: qty 1

## 2020-11-27 MED ORDER — ONDANSETRON 4 MG PO TBDP: 4.0000 mg | ORAL_TABLET | Freq: Four times a day (QID) | ORAL | Status: DC | PRN

## 2020-11-27 MED ORDER — PHENYLEPHRINE HCL (PRESSORS) 10 MG/ML IV SOLN
INTRAVENOUS | Status: DC | PRN
  Administered 2020-11-27: 120 ug via INTRAVENOUS

## 2020-11-27 MED ORDER — ONDANSETRON 4 MG PO TBDP: 4.0000 mg | ORAL_TABLET | Freq: Three times a day (TID) | ORAL | 0 refills | Status: AC | PRN

## 2020-11-27 MED ORDER — OXYCODONE-ACETAMINOPHEN 5-325 MG PO TABS: 1.0000 | ORAL_TABLET | Freq: Four times a day (QID) | ORAL | 0 refills | Status: AC | PRN

## 2020-11-27 MED ORDER — DEXAMETHASONE SODIUM PHOSPHATE 4 MG/ML IJ SOLN
INTRAMUSCULAR | Status: DC | PRN
  Administered 2020-11-27: 8 mg via INTRAVENOUS

## 2020-11-27 MED ORDER — HEMOSTATIC AGENTS (NO CHARGE) OPTIME
TOPICAL | Status: DC | PRN
  Administered 2020-11-27: 1 via TOPICAL

## 2020-11-27 MED ORDER — GABAPENTIN 300 MG PO CAPS
ORAL_CAPSULE | ORAL | Status: AC
  Filled 2020-11-27: qty 1

## 2020-11-27 MED ORDER — LIDOCAINE-EPINEPHRINE 0.5 %-1:200000 IJ SOLN
INTRAMUSCULAR | Status: DC | PRN
  Administered 2020-11-27: 8 mL

## 2020-11-27 MED ORDER — SUGAMMADEX SODIUM 200 MG/2ML IV SOLN
INTRAVENOUS | Status: DC | PRN
  Administered 2020-11-27: 200 mg via INTRAVENOUS

## 2020-11-27 MED ORDER — SIMETHICONE 80 MG PO CHEW
40.0000 mg | CHEWABLE_TABLET | Freq: Four times a day (QID) | ORAL | Status: DC | PRN
  Administered 2020-11-27: 40 mg via ORAL

## 2020-11-27 MED ORDER — SIMETHICONE 80 MG PO CHEW
CHEWABLE_TABLET | ORAL | Status: AC
  Filled 2020-11-27: qty 1

## 2020-11-27 MED ORDER — SODIUM CHLORIDE 0.9 % IR SOLN
Status: DC | PRN
  Administered 2020-11-27: 900 mL

## 2020-11-27 MED ORDER — CEFAZOLIN SODIUM-DEXTROSE 2-4 GM/100ML-% IV SOLN
INTRAVENOUS | Status: AC
  Filled 2020-11-27: qty 100

## 2020-11-27 MED ORDER — DIPHENHYDRAMINE HCL 25 MG PO CAPS: 25.0000 mg | ORAL_CAPSULE | Freq: Four times a day (QID) | ORAL | Status: DC | PRN

## 2020-11-27 MED ORDER — INDIGOTINDISULFONATE SODIUM 8 MG/ML IJ SOLN
INTRAMUSCULAR | Status: DC | PRN
  Administered 2020-11-27: 5 mL via INTRAVENOUS

## 2020-11-27 MED ORDER — SODIUM CHLORIDE 0.9 % IR SOLN
Status: DC | PRN
  Administered 2020-11-27: 600 mL

## 2020-11-27 MED ORDER — KETOROLAC TROMETHAMINE 30 MG/ML IJ SOLN
30.0000 mg | Freq: Four times a day (QID) | INTRAMUSCULAR | Status: DC
  Administered 2020-11-27: 30 mg via INTRAVENOUS

## 2020-11-27 MED ORDER — HYDROMORPHONE HCL 1 MG/ML IJ SOLN: 0.5000 mg | INTRAMUSCULAR | Status: DC | PRN

## 2020-11-27 MED ORDER — SCOPOLAMINE 1 MG/3DAYS TD PT72
MEDICATED_PATCH | TRANSDERMAL | Status: AC
  Filled 2020-11-27: qty 1

## 2020-11-27 MED ORDER — KETOROLAC TROMETHAMINE 30 MG/ML IJ SOLN
INTRAMUSCULAR | Status: DC | PRN
  Administered 2020-11-27: 30 mg via INTRAVENOUS

## 2020-11-27 MED ORDER — GLYCOPYRROLATE 0.2 MG/ML IJ SOLN
INTRAMUSCULAR | Status: DC | PRN
  Administered 2020-11-27: .1 mg via INTRAVENOUS

## 2020-11-27 MED ORDER — FENTANYL CITRATE (PF) 100 MCG/2ML IJ SOLN
INTRAMUSCULAR | Status: DC | PRN
  Administered 2020-11-27: 100 ug via INTRAVENOUS
  Administered 2020-11-27 (×2): 25 ug via INTRAVENOUS
  Administered 2020-11-27 (×2): 50 ug via INTRAVENOUS

## 2020-11-27 SURGICAL SUPPLY — 65 items
ADH SKN CLS APL DERMABOND .7 (GAUZE/BANDAGES/DRESSINGS) ×6
AGENT HMST KT MTR STRL THRMB (HEMOSTASIS) ×3
APL SRG 38 LTWT LNG FL B (MISCELLANEOUS) ×3
APPLICATOR ARISTA FLEXITIP XL (MISCELLANEOUS) ×4 IMPLANT
BARRIER ADHS 3X4 INTERCEED (GAUZE/BANDAGES/DRESSINGS) IMPLANT
BRR ADH 4X3 ABS CNTRL BYND (GAUZE/BANDAGES/DRESSINGS)
COVER BACK TABLE 60X90IN (DRAPES) ×4 IMPLANT
COVER TIP SHEARS 8 DVNC (MISCELLANEOUS) ×3 IMPLANT
COVER TIP SHEARS 8MM DA VINCI (MISCELLANEOUS) ×4
DECANTER SPIKE VIAL GLASS SM (MISCELLANEOUS) ×4 IMPLANT
DEFOGGER SCOPE WARMER CLEARIFY (MISCELLANEOUS) ×4 IMPLANT
DERMABOND ADVANCED (GAUZE/BANDAGES/DRESSINGS) ×2
DERMABOND ADVANCED .7 DNX12 (GAUZE/BANDAGES/DRESSINGS) ×6 IMPLANT
DRAPE ARM DVNC X/XI (DISPOSABLE) ×12 IMPLANT
DRAPE COLUMN DVNC XI (DISPOSABLE) ×3 IMPLANT
DRAPE DA VINCI XI ARM (DISPOSABLE) ×16
DRAPE DA VINCI XI COLUMN (DISPOSABLE) ×4
DRAPE UTILITY XL STRL (DRAPES) ×4 IMPLANT
DURAPREP 26ML APPLICATOR (WOUND CARE) ×4 IMPLANT
ELECT REM PT RETURN 9FT ADLT (ELECTROSURGICAL) ×4
ELECTRODE REM PT RTRN 9FT ADLT (ELECTROSURGICAL) ×3 IMPLANT
GAUZE 4X4 16PLY RFD (DISPOSABLE) IMPLANT
GAUZE PACKING 1 X5 YD ST (GAUZE/BANDAGES/DRESSINGS) ×4 IMPLANT
GLOVE SURG ENC MOIS LTX SZ6 (GLOVE) ×16 IMPLANT
GLOVE SURG ENC MOIS LTX SZ7 (GLOVE) ×12 IMPLANT
GLOVE SURG UNDER POLY LF SZ6 (GLOVE) ×12 IMPLANT
GLOVE SURG UNDER POLY LF SZ7 (GLOVE) ×8 IMPLANT
HEMOSTAT ARISTA ABSORB 3G PWDR (HEMOSTASIS) ×4 IMPLANT
HOLDER FOLEY CATH W/STRAP (MISCELLANEOUS) ×4 IMPLANT
IRRIG SUCT STRYKERFLOW 2 WTIP (MISCELLANEOUS) ×4
IRRIGATION SUCT STRKRFLW 2 WTP (MISCELLANEOUS) ×3 IMPLANT
KIT TURNOVER CYSTO (KITS) ×4 IMPLANT
LEGGING LITHOTOMY PAIR STRL (DRAPES) ×4 IMPLANT
MANIPULATOR ADVINCU DEL 2.5 PL (MISCELLANEOUS) ×4 IMPLANT
MANIPULATOR ADVINCU DEL 3.0 PL (MISCELLANEOUS) IMPLANT
MANIPULATOR ADVINCU DEL 3.5 PL (MISCELLANEOUS) IMPLANT
MANIPULATOR ADVINCU DEL 4.0 PL (MISCELLANEOUS) IMPLANT
OBTURATOR OPTICAL STANDARD 8MM (TROCAR) ×4
OBTURATOR OPTICAL STND 8 DVNC (TROCAR) ×3
OBTURATOR OPTICALSTD 8 DVNC (TROCAR) ×3 IMPLANT
PACK ROBOT WH (CUSTOM PROCEDURE TRAY) ×4 IMPLANT
PACK ROBOTIC GOWN (GOWN DISPOSABLE) ×4 IMPLANT
PACK TRENDGUARD 450 HYBRID PRO (MISCELLANEOUS) ×3 IMPLANT
PAD OB MATERNITY 4.3X12.25 (PERSONAL CARE ITEMS) ×4 IMPLANT
PAD PREP 24X48 CUFFED NSTRL (MISCELLANEOUS) ×4 IMPLANT
SEAL CANN UNIV 5-8 DVNC XI (MISCELLANEOUS) ×9 IMPLANT
SEAL XI 5MM-8MM UNIVERSAL (MISCELLANEOUS) ×12
SET IRRIG Y TYPE TUR BLADDER L (SET/KITS/TRAYS/PACK) ×4 IMPLANT
SET TRI-LUMEN FLTR TB AIRSEAL (TUBING) ×4 IMPLANT
SLING TRANS VAGINAL TAPE (Sling) ×1 IMPLANT
SLING UTERINE/ABD GYNECARE TVT (Sling) ×3 IMPLANT
SURGIFLO W/THROMBIN 8M KIT (HEMOSTASIS) ×4 IMPLANT
SUT VIC AB 0 CT1 18XCR BRD8 (SUTURE) ×3 IMPLANT
SUT VIC AB 0 CT1 36 (SUTURE) ×4 IMPLANT
SUT VIC AB 0 CT1 8-18 (SUTURE) ×4
SUT VIC AB 2-0 CT1 (SUTURE) ×4 IMPLANT
SUT VICRYL RAPIDE 3 0 (SUTURE) ×8 IMPLANT
SUT VLOC 180 0 9IN  GS21 (SUTURE) ×4
SUT VLOC 180 0 9IN GS21 (SUTURE) ×3 IMPLANT
TOWEL OR 17X26 10 PK STRL BLUE (TOWEL DISPOSABLE) ×4 IMPLANT
TRAY FOLEY W/BAG SLVR 14FR LF (SET/KITS/TRAYS/PACK) ×4 IMPLANT
TRENDGUARD 450 HYBRID PRO PACK (MISCELLANEOUS) ×4
TROCAR BLADELESS OPT 5 100 (ENDOMECHANICALS) IMPLANT
TROCAR PORT AIRSEAL 5X120 (TROCAR) ×4 IMPLANT
WATER STERILE IRR 1000ML POUR (IV SOLUTION) ×4 IMPLANT

## 2020-11-27 NOTE — Anesthesia Procedure Notes (Signed)
Procedure Name: Intubation Date/Time: 11/27/2020 7:48 AM Performed by: Georgeanne Nim, CRNA Pre-anesthesia Checklist: Patient identified, Emergency Drugs available, Suction available, Patient being monitored and Timeout performed Patient Re-evaluated:Patient Re-evaluated prior to induction Oxygen Delivery Method: Circle system utilized Preoxygenation: Pre-oxygenation with 100% oxygen Induction Type: IV induction Ventilation: Mask ventilation without difficulty Laryngoscope Size: Mac and 4 Grade View: Grade I Tube size: 7.0 mm Number of attempts: 1 Airway Equipment and Method: Stylet Placement Confirmation: ETT inserted through vocal cords under direct vision,  positive ETCO2,  CO2 detector and breath sounds checked- equal and bilateral Secured at: 21 cm Tube secured with: Tape Dental Injury: Teeth and Oropharynx as per pre-operative assessment

## 2020-11-27 NOTE — Transfer of Care (Signed)
Immediate Anesthesia Transfer of Care Note  Patient: Brenda Hull  Procedure(s) Performed: XI ROBOTIC ASSISTED TOTAL HYSTERECTOMY (N/A Abdomen) TRANSVAGINAL TAPE (TVT) PROCEDURE (N/A Vagina ) CYSTOSCOPY (N/A Bladder)  Patient Location: PACU  Anesthesia Type:General  Level of Consciousness: awake, alert , oriented and patient cooperative  Airway & Oxygen Therapy: Patient Spontanous Breathing  Post-op Assessment: Report given to RN and Post -op Vital signs reviewed and stable  Post vital signs: Reviewed and stable  Last Vitals:  Vitals Value Taken Time  BP 125/61 11/27/20 1015  Temp    Pulse 93 11/27/20 1017  Resp 17 11/27/20 1017  SpO2 100 % 11/27/20 1017  Vitals shown include unvalidated device data.  Last Pain:  Vitals:   11/27/20 0553  TempSrc: Oral  PainSc: 4       Patients Stated Pain Goal: 5 (11/27/20 0553)  Complications: No complications documented.

## 2020-11-27 NOTE — Brief Op Note (Signed)
11/27/2020  10:00 AM  PATIENT:  Brenda Hull  30 y.o. female  PRE-OPERATIVE DIAGNOSIS:  PELVIC PAIN; STRESS URINARY INCONTINENCE  POST-OPERATIVE DIAGNOSIS:  PELVIC PAIN; STRESS URINARY INCONTINENCE  PROCEDURE:  Procedure(s): XI ROBOTIC ASSISTED TOTAL HYSTERECTOMY (N/A) TRANSVAGINAL TAPE (TVT) PROCEDURE (N/A) CYSTOSCOPY (N/A)  SURGEON:  Surgeon(s) and Role:    * Taam-Akelman, Griselda Miner, MD - Primary    * Carrington Clamp, MD - Assisting  ANESTHESIA:   general  EBL:  75 mL   LOCAL MEDICATIONS USED:  LIDOCAINE with epi, surgiflo, estrogen cream  SPECIMEN:  No Specimen  DISPOSITION OF SPECIMEN:  PATHOLOGY  COUNTS:  YES  TOURNIQUET:  * No tourniquets in log *  DICTATION: .Note written in EPIC  PLAN OF CARE: Admit for overnight observation  PATIENT DISPOSITION:  PACU - hemodynamically stable.   Delay start of Pharmacological VTE agent (>24hrs) due to surgical blood loss or risk of bleeding: not applicable  After general anesthesia was administered, the patient was prepped and draped in the usual sterile fashion.  After Dr. Claudine Mouton finished her Robotic TLH with salpingectomies, the foley remained in placed in and the apex of the cystocele grasped with two allises.  Allises were used to grasp the vaginal mucosa in the midline superior to inferior just below the urethral opening.  The mucosa was injected with 1% lidocaine with epi in the midline and a scalpel used to incise the vaginal mucosa vertically.  Allises were used to retract the vaginal mucosa as the vesico-vaginal fascia was removed from the mucosa with blunt and sharp dissection and adequate room midurethral to pelvic floor bilaterally was developed.  Two small puncture incisions were then made two cm from midline just above the pubic symphysis.  The abdominal needles from the Gynecare TVT set were introduced behind the pubic bone, through the pelvic floor and out the vagina carefully on each respective side, being careful  to hug the posterior of the pubic bone.  The foley was removed and indigo carmine given.  Cystoscopy revealed excellent bilateral spill from each ureteral orifice and NO NEEDLES IN THE BLADDER.   The urethra was clear as well.  The foley was replaced and the vaginal needles attached to the abdominal needles.  The tape was pulled into place through the abdominal incisions, the sheaths removed while a kelly was in place under the mesh sling and the tape cut at the level of just below the skin.  The tape was checked and found to be tight enough and laying flat.   Surgiflo was placed in the corners up by the pelvic floor to achieve hemostasis of some small bleeders out of reach and too close to the sling for stitches.  Once hemostasis was achieved, three mattress stitches of 0-vicryl were placed to close the vesico-vaginal fascia under the bladder.  The vaginal mucosa was trimmed and then closed with a running lock stitch of 2-0 vicryl.  A vaginal packing with estrace was placed and the patient returned to the recovery room in stable condition.

## 2020-11-27 NOTE — Anesthesia Postprocedure Evaluation (Signed)
Anesthesia Post Note  Patient: Brenda Hull  Procedure(s) Performed: XI ROBOTIC ASSISTED TOTAL HYSTERECTOMY (N/A Abdomen) TRANSVAGINAL TAPE (TVT) PROCEDURE (N/A Vagina ) CYSTOSCOPY (N/A Bladder)     Patient location during evaluation: PACU Anesthesia Type: General Level of consciousness: awake Pain management: pain level controlled Vital Signs Assessment: post-procedure vital signs reviewed and stable Respiratory status: spontaneous breathing Cardiovascular status: stable Postop Assessment: no apparent nausea or vomiting Anesthetic complications: no   No complications documented.  Last Vitals:  Vitals:   11/27/20 1143 11/27/20 1300  BP: 104/71 97/62  Pulse: 95 71  Resp: 18 16  Temp:  (!) 36.3 C  SpO2: 98% 97%    Last Pain:  Vitals:   11/27/20 1230  TempSrc:   PainSc: 6                  Tanina Barb

## 2020-11-27 NOTE — Brief Op Note (Signed)
11/27/2020  9:57 AM  PATIENT:  Brenda Hull  30 y.o. female  PRE-OPERATIVE DIAGNOSIS:  PELVIC PAIN, Stress Urinary incontinence   POST-OPERATIVE DIAGNOSIS:  PELVIC PAIN,  Stress Urinary incontinence   PROCEDURE:  Procedure(s): XI ROBOTIC ASSISTED TOTAL HYSTERECTOMY (N/A) TRANSVAGINAL TAPE (TVT) PROCEDURE (N/A) CYSTOSCOPY (N/A)  SURGEON:  Surgeon(s) and Role:    * Taam-Akelman, Griselda Miner, MD - Primary    * Carrington Clamp, MD - Assisting  ANESTHESIA:   none  EBL:  75 mL   BLOOD ADMINISTERED:none  DRAINS: none   LOCAL MEDICATIONS USED:  OTHER Ropivacaine   SPECIMEN:  Source of Specimen:  uterus, cervix, bilateral fallopian tubes  DISPOSITION OF SPECIMEN:  PATHOLOGY  COUNTS:  YES  TOURNIQUET:  * No tourniquets in log *  DICTATION: .Note written in EPIC  PLAN OF CARE: Admit for overnight observation  Vs. Discharge home same day  PATIENT DISPOSITION:  PACU - hemodynamically stable.   Delay start of Pharmacological VTE agent (>24hrs) due to surgical blood loss or risk of bleeding: not applicable

## 2020-11-27 NOTE — Progress Notes (Signed)
11/27/2020 Pt. D/c home with vaginal packing and foley per verbal order Dr. Pollie Meyer. Verbal order received to instruct pt. To remove packing tomorrow morning and return to MD office on Wednesday to remove foley. Orders enacted. Pt. And spouse verbalized understanding. Pt. D/c home per orders.  Brenda Hull, Blanchard Kelch

## 2020-11-27 NOTE — Interval H&P Note (Signed)
History and Physical Interval Note:  11/27/2020 7:11 AM  Brenda Hull  has presented today for surgery, with the diagnosis of PELVIC PAIN.  The various methods of treatment have been discussed with the patient and family. After consideration of risks, benefits and other options for treatment, the patient has consented to  Procedure(s): XI ROBOTIC ASSISTED TOTAL HYSTERECTOMY (N/A) TRANSVAGINAL TAPE (TVT) PROCEDURE (N/A) CYSTOSCOPY (N/A) as a surgical intervention.  The patient's history has been reviewed, patient examined, no change in status, stable for surgery.  I have reviewed the patient's chart and labs.  Questions were answered to the patient's satisfaction.    Hgb 10.4    Brenda Hull

## 2020-11-27 NOTE — Op Note (Signed)
OPERATIVE NOTE PATIENT: Brenda Hull 30 y.o.   PRE-OPERATIVE DIAGNOSIS:  PELVIC PAIN; STRESS URINARY INCONTINENCE  POST-OPERATIVE DIAGNOSIS:  PELVIC PAIN; STRESS URINARY INCONTINENCE  PROCEDURE:  Procedure(s): XI ROBOTIC ASSISTED TOTAL HYSTERECTOMY (N/A) TRANSVAGINAL TAPE (TVT) PROCEDURE (N/A) CYSTOSCOPY (N/A)  SURGEON:  Surgeon(s) and Role:    * Taam-Akelman, Griselda Miner, MD - Primary    * Carrington Clamp, MD - Assisting, Primary for TVT procedure   ANESTHESIA:   general   EBL:  75cc   BLOOD ADMINISTERED: none   DRAINS: none    LOCAL MEDICATIONS USED:  Ropivicaine, indigo carmine, 0.5% lidocaine with epinephrine  ANTIBIOTICS: Ancef 2g   SPECIMEN: Uterus, cervix, bilateral fallopian tubes. Uterus weighing 46.6g   DISPOSITION OF SPECIMEN:  PATHOLOGY   COUNTS:  YES   TOURNIQUET:  * No tourniquets in log *   PLAN OF CARE: Discharge to home after PACU vs. Overnight observation    PATIENT DISPOSITION:  PACU - hemodynamically stable.   Delay start of Pharmacological VTE agent (>24hrs) due to surgical blood loss or risk of bleeding: not applicable  Complications: None  Findings: Small mobile uterus on bimanual exam. Adhesions of the ascending colon to the right anterior abdominal wall. Normal uterus, bilateral ovaries and fallopian tubes. No endometriosis noted. Normal appendix. On cystoscopy the bladder was intact and bilateral spill was seen from each ureteral orifcace.  Description of procedure:  After consent was verified the patient was taken to the operating room.  After adequate anesthesia was achieved the patient was positioned prepped and draped in the usual sterile fashion.  Exam revealed  A small mobile uterus. A speculum was placed in the vagina and the uterus was sounded to 7cm and the cervix dilated with Shawnie Pons dilators.  The 2.5 cm KOH ring at the insula was assembled and placed in the proper fashion.  The speculum was removed and the bladder was drained with  a Foley catheter.  Attention was turned to the abdomen where  a 1 cm incision was made above the umbilicus.  The 8.5 mm robotic trochar as used for direct entry, and the abdomen was insufflated.  The 8.75mm robotic trochars were placed 10 cm from the midline incision.  The patient was placed in Trendelenburg. A 5 mm assistant port was placed 3 cm above the left robotic trocar. All trochars were inserted under direct visualization of the camera.  The robot was docked.  The fenestrated bipolar was placed on arm 1 and the monopolar scissors on arm 3 and introduced under direct visualization of the camera.  Survey of the abdomen revealed: Adhesions of the ascending colon to the right anterior abdominal wall. Normal uterus, bilateral ovaries and fallopian tubes. No endometriosis noted. Normal appendix. Ureters identified multiple points of the case and were out of the filed of dissection. I then broke scrub and sat on the console.   The adhesions of the ascending colon were taken down with blunt and sharp dissection using monopolar scissors.   The left fallopian tube was isolated and cauterized with the bipolar.  The round ligament was divided with the bipolar cautery and monopolar scissors.  The utero-ovarian ligament was then divided with the bipolar cautery and shears.  The posterior broad ligament was then divided with the monopolar scissors to the level of the uterosacral ligament.  The anterior leaf of the broad ligament was developed pass the KOH ring for the bladder flap. The uterine artery was cuaterized  The right tube was then cauterized with bipolar  and divided with shears followed by the round ligament and the utero-ovarian ligament both divided with the bipolar forceps and scissors.  The posterior leaf was the divided to the level of the utero sacral ligament. The anterior leaf was developed past the KOH ring for the bladder flap. The right uterine artery was cauterized.  The ureters were identified  well out of the field of dissection. Both uterine arteries were then ligated.   The bladder was retracted inferiorly and the vesicouterine fascia was incised in the midline and the bladder was removed 1 cm below the KOH ring.  The monopolar scissors were then used for the colpotomy and incised the vagina at the level of the KOH ring circumferentially.  After the uterus and cervix were amputated the cuff was inspected and noted to be hemostatic.  The uterus was removed through the vagina.   The scissors were exchanged for the Mega suture cut needle driver and the cuff was closed with 0 Vicryl V-Loc in a running fashion. The cuff and pedicles were hemostatic. Irrigation was performed. The ureters were peristalsing bilaterally and lateral to the pedicles. Pressure was reduced and hemostasis was noted again. Ropivicaine was introduced in the pelvis.  The robot was undocked and I scrubbed back in. The skin incisions were closed with 3-0 vicryl rapide in a subcuticular fashion. Dermabond was placed. All instruments were removed from the vagina and the vagina was intact on speculum exam and by palpation.  See separate dictation by Dr. Henderson Cloud for the transvaginal tape and cystoscopy portion of the procedure. After completion of the transvaginal tape and cystoscopy, the patient taken to the recovery room in a stable condition.   Joelyn Lover K Taam-Akelman 11/27/20 10:10 AM

## 2020-11-27 NOTE — Discharge Instructions (Signed)
Prescriptions Motrin 800mg  every 8 hours for pain Acetaminophen 650mg  every 6 hours for moderate pain Percocet (Oxycodone 5mg -acetaminophen 325mg ) every 4 hours for severe pain.  Make sure to not exceed acetaminophen 3000mg  every day. Take keflex four times a day to prevent a urinary tract infection while you have the foley catheter in Take pyridium three times a day for bladder irritation while you have the foley catheter in  Hysterectomy, Care After The following information offers guidance on how to care for yourself after your procedure. Your health care provider may also give you more specific instructions. If you have problems or questions, contact your health care provider. What can I expect after the procedure? After the procedure, it is common to have:  Soreness and numbness in your incision areas.  Abdominal pain. You will be given pain medicine to control it.  Vaginal bleeding and discharge. You will need to use a sanitary pad after this procedure.  Tiredness (fatigue).  Poor appetite.  Less interest in sex.  Feelings of sadness or other emotions. If your ovaries were also removed, it is common to have symptoms of menopause, such as hot flashes, night sweats, and lack of sleep (insomnia). Follow these instructions at home: Medicines  Take over-the-counter and prescription medicines only as told by your health care provider.  Do not take aspirin or NSAIDs, such as ibuprofen. These medicines can cause bleeding.  Ask your health care provider if the medicine prescribed to you: ? Requires you to avoid driving or using heavy machinery. ? Can cause constipation. You may need to take these actions to prevent or treat constipation:  Drink enough fluid to keep your urine pale yellow.  Take over-the-counter or prescription medicines.  Eat foods that are high in fiber, such as beans, whole grains, and fresh fruits and vegetables.  Limit foods that are high in fat and  processed sugars, such as fried or sweet foods. Incision care  Follow instructions from your health care provider about how to take care of your incisions. Make sure you: ? Wash your hands with soap and water for at least 20 seconds before and after you change your bandage (dressing). If soap and water are not available, use hand sanitizer. ? Change your dressing as told by your health care provider. ? Leave stitches (sutures), skin glue, or adhesive strips in place. These skin closures may need to stay in place for 2 weeks or longer. If adhesive strip edges start to loosen and curl up, you may trim the loose edges. Do not remove adhesive strips completely unless your health care provider tells you to do that.  Check your incision areas every day for signs of infection. Check for: ? More redness, swelling, or pain. ? Fluid or blood. ? Warmth. ? Pus or a bad smell.   Activity  Rest as told by your healthcare provider.  Return to your normal activities as told by your health care provider. Ask your health care provider what activities are safe for you.  Avoid sitting for a long time without moving. Get up to take short walks every 1-2 hours. This is important to improve blood flow and breathing. Ask for help if you feel weak or unsteady.  Do not lift anything that is heavier than 10 lb (4.5 kg), or the limit that you are told, until your health care provider says that it is safe.  If you were given a sedative during the procedure, it can affect you for several hours. Do  not drive or operate machinery until your health care provider says that it is safe.   Lifestyle  Do not use any products that contain nicotine or tobacco. These products include cigarettes, chewing tobacco, and vaping devices, such as e-cigarettes. These can delay healing after surgery. If you need help quitting, ask your health care provider.  Do not drink alcohol until your health care provider approves. General  instructions  Do not douche, use tampons, or have sex for at least 6 weeks, or as told by your health care provider.  If you struggle with physical or emotional changes after your procedure, speak with your health care provider or a therapist.  Do not take baths, swim, or use a hot tub until your health care provider approves. You may only be allowed to take showers for 2-3 weeks.  Keep your dressing dry until your health care provider says it can be removed.  Try to have someone at home with you for the first 1-2 weeks to help with your daily chores.  Wear compression stockings as told by your health care provider. These stockings help to prevent blood clots and reduce swelling in your legs.  Keep all follow-up visits. This is important.   Contact a health care provider if:  You have any of these signs of infection: ? More redness, swelling, warmth, or pain around an incision. ? Fluid or blood coming from an incision. ? Pus or a bad smell coming from an incision. ? Chills or a fever.  An incision opens.  Your pain medicine is not helping.  You feel dizzy or light-headed.  You have pain or bleeding when you urinate.  You have nausea and vomiting that does not go away.  You have pus, or a bad-smelling discharge coming from your vagina. Get help right away if:  You have a fever and your symptoms suddenly get worse.  You have severe abdominal pain.  You have chest pain.  You have shortness of breath.  You faint.  You have pain, swelling, or redness in your leg.  You have heavy vaginal bleeding and blood clots, soaking through a sanitary pad in less than 1 hour. These symptoms may represent a serious problem that is an emergency. Do not wait to see if the symptoms will go away. Get medical help right away. Call your local emergency services (911 in the [place] Do not drive yourself to the hospital. Summary  After the procedure, it is common to have abdominal pain and  vaginal bleeding.  Wear a sanitary pad for vaginal discharge or bleeding.  You should not drive or lift heavy objects until your health care provider says that it is safe.  Contact your health care provider if you have any symptoms of infection, heavy vaginal bleeding, nausea, vomiting, or shortness of breath. This information is not intended to replace advice given to you by your health care provider. Make sure you discuss any questions you have with your health care provider. Document Revised: 04/21/2020 Document Reviewed: 04/21/2020 Elsevier Patient Education  2021 [employer].

## 2020-11-28 ENCOUNTER — Encounter (HOSPITAL_BASED_OUTPATIENT_CLINIC_OR_DEPARTMENT_OTHER): Payer: Self-pay | Admitting: Obstetrics & Gynecology

## 2020-11-28 LAB — SURGICAL PATHOLOGY

## 2021-11-11 IMAGING — DX DG CERVICAL SPINE COMPLETE 4+V
5 series · 5 of 5 positions shown · non-contrast
Comparison: None.

CLINICAL DATA: Left-sided neck pain radiating to left arm with left
arm weakness 2 days. No injury.

EXAM:
CERVICAL SPINE - COMPLETE 4+ VIEW

[c-spine lat]
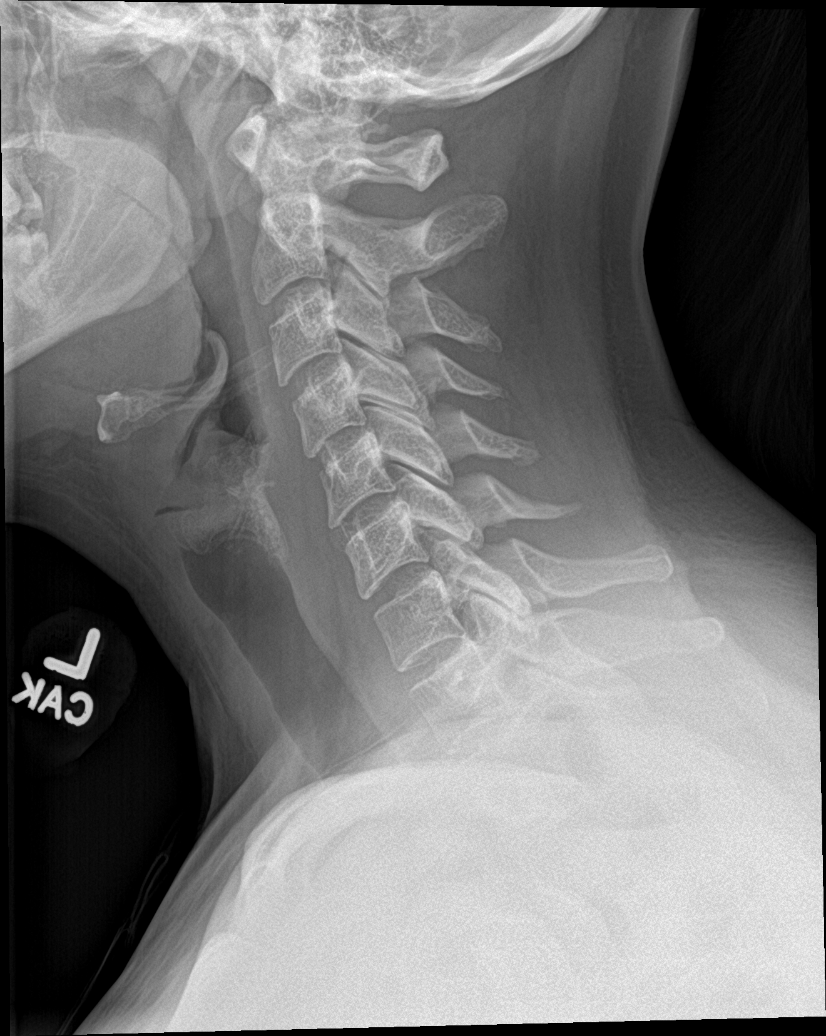

[c-spine obl (1 of 2)]
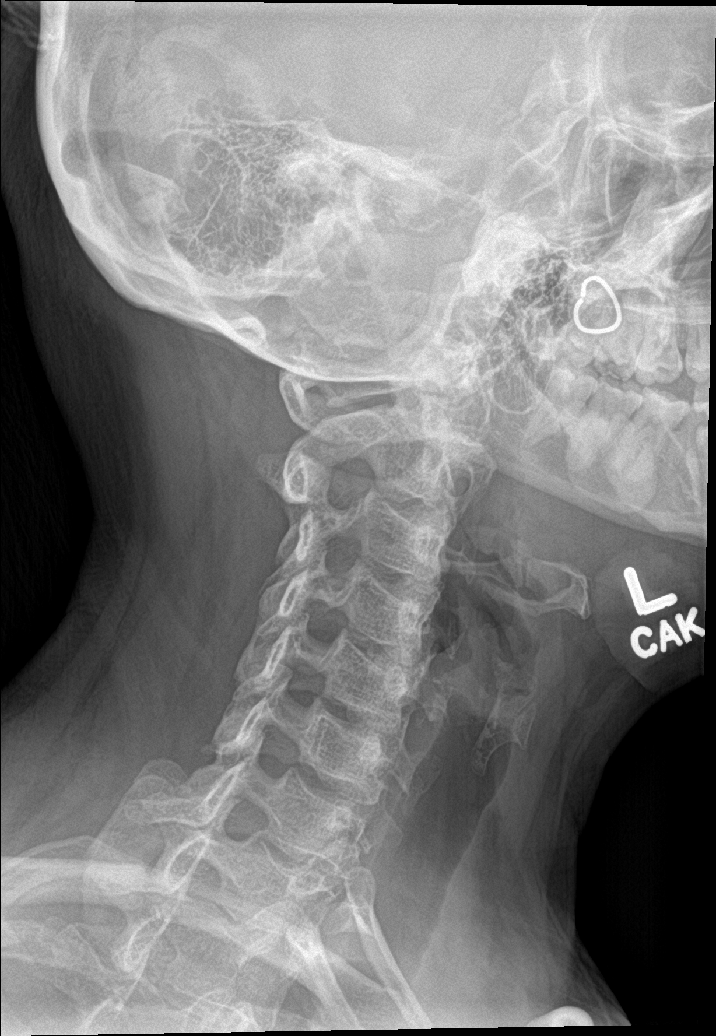

[c-spine obl (2 of 2)]
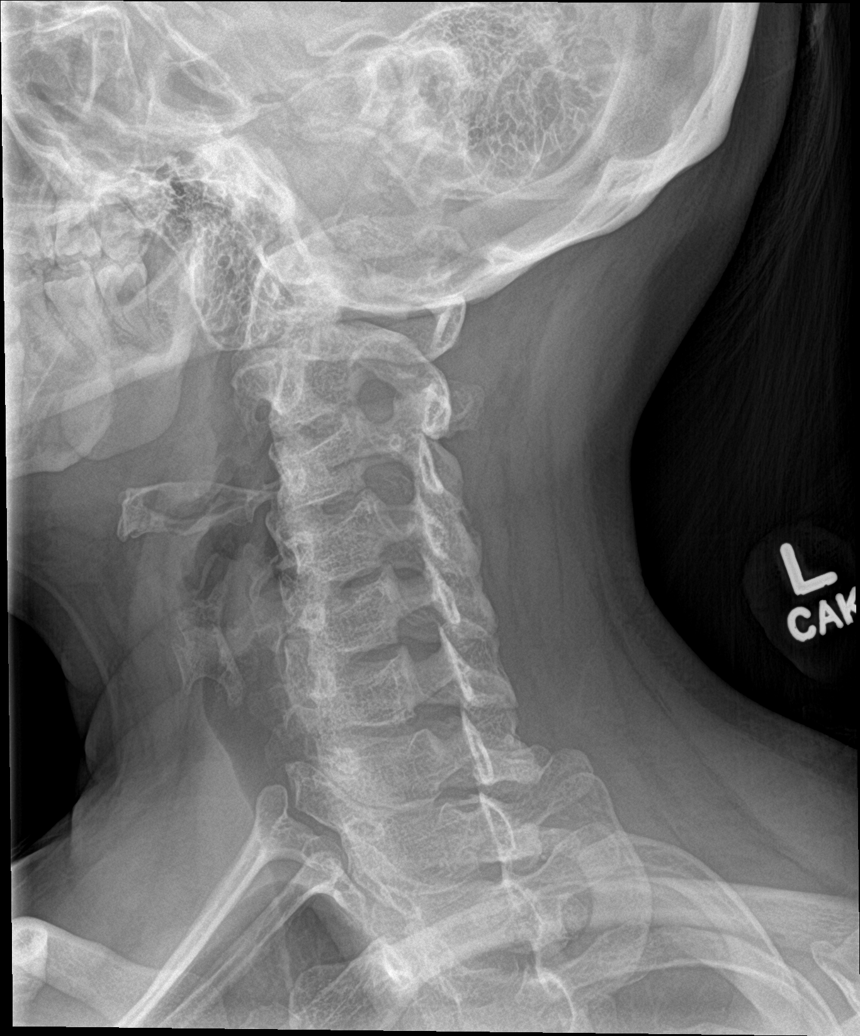

[c-spine ap]
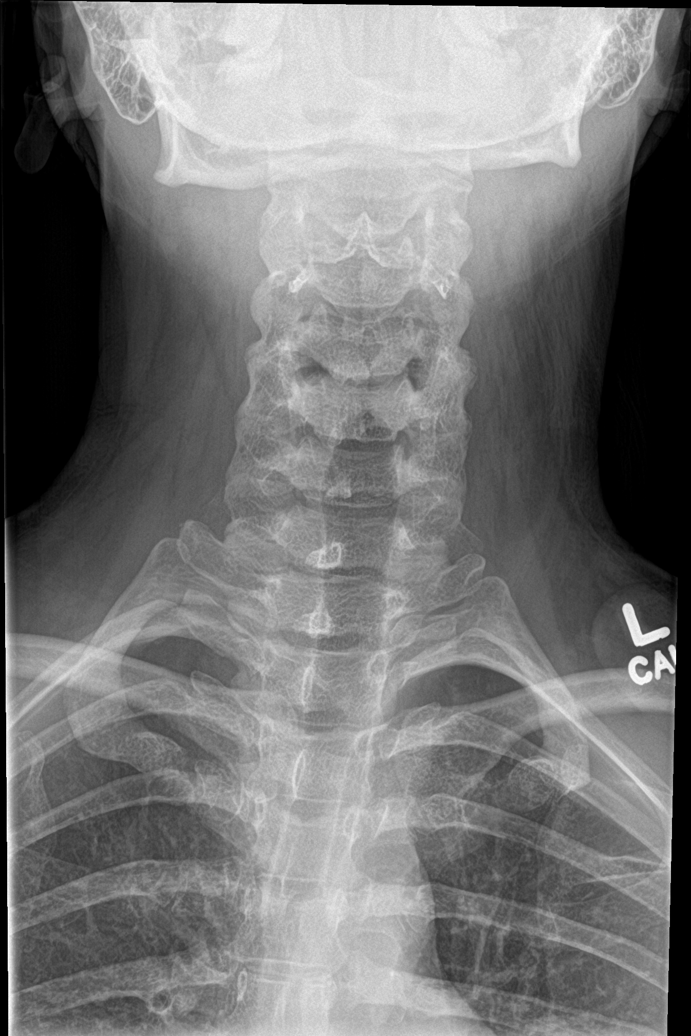

[c-spine open mouth]
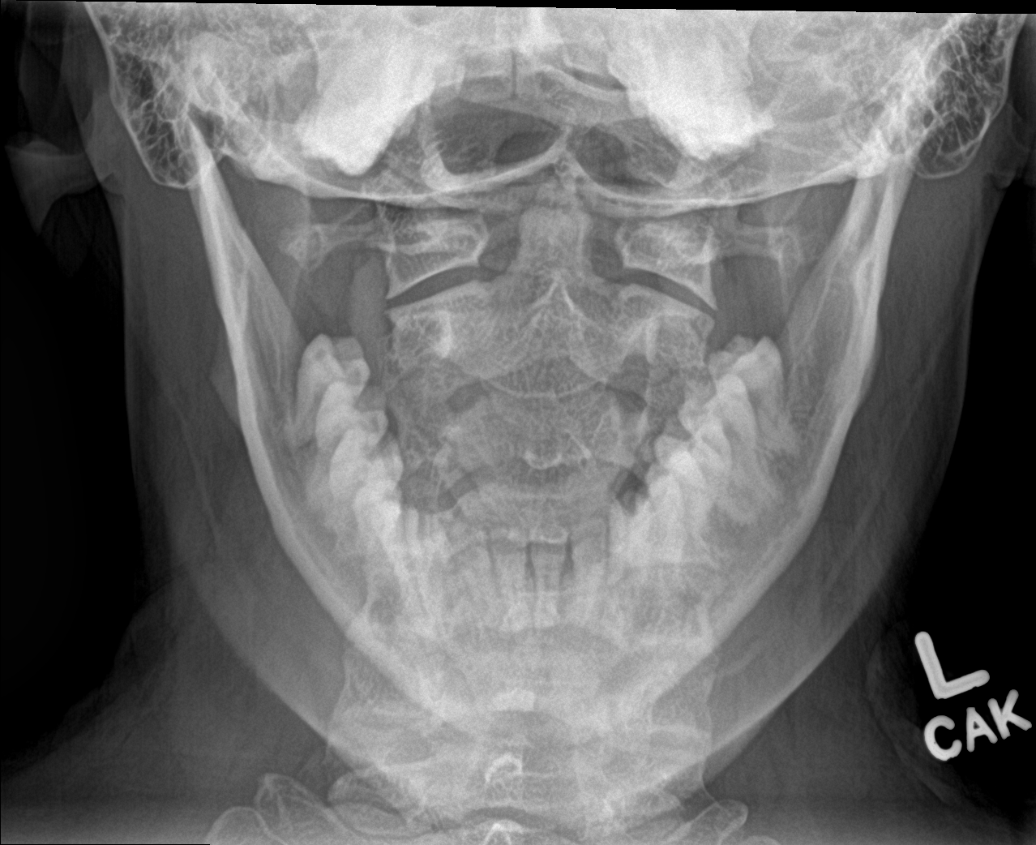

[5 of 5 positions shown; findings below may reference images not displayed]

FINDINGS: Vertebral body alignment, heights and disc space heights are normal.
Atlantoaxial articulation is normal. No significant neural foraminal
narrowing. Prevertebral soft tissues are normal.
IMPRESSION: Negative cervical spine radiographs.
